# Patient Record
Sex: Female | Born: 1985 | Hispanic: No | Marital: Married | Smoking: Never smoker
Health system: Southern US, Community
[De-identification: ages and names within clinical notes are randomized; demographics above are authoritative.]

## PROBLEM LIST (undated history)

## (undated) ENCOUNTER — Inpatient Hospital Stay (HOSPITAL_COMMUNITY): Payer: Self-pay

---

## 2020-07-06 ENCOUNTER — Encounter (HOSPITAL_BASED_OUTPATIENT_CLINIC_OR_DEPARTMENT_OTHER): Payer: Self-pay | Admitting: Emergency Medicine

## 2020-07-06 ENCOUNTER — Emergency Department (HOSPITAL_BASED_OUTPATIENT_CLINIC_OR_DEPARTMENT_OTHER)
Admission: EM | Admit: 2020-07-06 | Discharge: 2020-07-06 | Disposition: A | Discharge status: Home / Self Care | Payer: Self-pay | Attending: Emergency Medicine | Admitting: Emergency Medicine

## 2020-07-06 ENCOUNTER — Other Ambulatory Visit: Payer: Self-pay

## 2020-07-06 DIAGNOSIS — N390 Urinary tract infection, site not specified: Secondary | ICD-10-CM | POA: Insufficient documentation

## 2020-07-06 LAB — URINALYSIS, ROUTINE W REFLEX MICROSCOPIC
Bilirubin Urine: NEGATIVE
Glucose, UA: NEGATIVE mg/dL
Hgb urine dipstick: NEGATIVE
Ketones, ur: NEGATIVE mg/dL
Nitrite: NEGATIVE
Protein, ur: NEGATIVE mg/dL
Specific Gravity, Urine: 1.025 (ref 1.005–1.030)
pH: 5.5 (ref 5.0–8.0)

## 2020-07-06 LAB — URINALYSIS, MICROSCOPIC (REFLEX): RBC / HPF: NONE SEEN RBC/hpf (ref 0–5)

## 2020-07-06 LAB — PREGNANCY, URINE: Preg Test, Ur: NEGATIVE

## 2020-07-06 MED ORDER — PHENAZOPYRIDINE HCL 200 MG PO TABS
200.0000 mg | ORAL_TABLET | Freq: Three times a day (TID) | ORAL | 0 refills | Status: DC
Start: 2020-07-06 — End: 2020-08-15

## 2020-07-06 MED ORDER — NITROFURANTOIN MONOHYD MACRO 100 MG PO CAPS
100.0000 mg | ORAL_CAPSULE | Freq: Two times a day (BID) | ORAL | 0 refills | Status: AC
Start: 2020-07-06 — End: 2020-07-11

## 2020-07-06 NOTE — ED Triage Notes (Signed)
Pt reports vaginal irritation, dysuria and discharge after sexual intercourse x4 days. Pt denies bleeding, denies fever.  Language barrier, patient speaks [place]

## 2020-07-06 NOTE — ED Provider Notes (Signed)
[facility] EMERGENCY DEPARTMENT Provider Note   CSN: [id number] Arrival date & time: 07/06/20  1212     History Chief Complaint  Patient presents with   Vaginal Itching    Miranda Caldwell is a 34 y.o. female.  34 year old female presents with complaint of urinary frequency with dysuria.  Patient states that she got married 2 weeks ago, symptoms started about 5 days ago.  Denies discharge, hematuria, back pain, fevers, vomiting.  A language interpreter was used [place]).       History reviewed. No pertinent past medical history.  There are no problems to display for this patient.   History reviewed. No pertinent surgical history.   OB History   No obstetric history on file.     History reviewed. No pertinent family history.  Social History   Tobacco Use   Smoking status: Never Smoker  Substance Use Topics   Alcohol use: Never   Drug use: Never    Home Medications Prior to Admission medications   Medication Sig Start Date End Date Taking? Authorizing Provider  nitrofurantoin, macrocrystal-monohydrate, (MACROBID) 100 MG capsule Take 1 capsule (100 mg total) by mouth 2 (two) times daily for 5 days. 07/06/20 07/11/20  Jeannie Fend, PA-C  phenazopyridine (PYRIDIUM) 200 MG tablet Take 1 tablet (200 mg total) by mouth 3 (three) times daily. 07/06/20   Jeannie Fend, PA-C    Allergies    Patient has no allergy information on record.  Review of Systems   Review of Systems  Constitutional: Negative for fever.  Respiratory: Negative for shortness of breath.   Cardiovascular: Negative for chest pain.  Gastrointestinal: Negative for abdominal pain, nausea and vomiting.  Genitourinary: Positive for dysuria and frequency. Negative for hematuria, vaginal bleeding and vaginal discharge.  Musculoskeletal: Negative for back pain.  Skin: Negative for rash and wound.  Allergic/Immunologic: Negative for immunocompromised state.  All other systems reviewed and  are negative.   Physical Exam Updated Vital Signs BP 112/70 (BP Location: Right Arm)    Pulse 92    Temp 98.4 F (36.9 C) (Oral)    Resp 18    Ht 5\' 4"  (1.626 m)    Wt 71.7 kg    LMP 06/17/2020 (Approximate)    SpO2 96%    BMI 27.12 kg/m   Physical Exam Vitals and nursing note reviewed.  Constitutional:      General: She is not in acute distress.    Appearance: She is well-developed. She is not diaphoretic.  HENT:     Head: Normocephalic and atraumatic.  Cardiovascular:     Rate and Rhythm: Normal rate and regular rhythm.     Pulses: Normal pulses.     Heart sounds: Normal heart sounds.  Pulmonary:     Effort: Pulmonary effort is normal.     Breath sounds: Normal breath sounds.  Abdominal:     Palpations: Abdomen is soft.     Tenderness: There is abdominal tenderness in the suprapubic area. There is no right CVA tenderness or left CVA tenderness.  Skin:    General: Skin is warm and dry.     Findings: No erythema or rash.  Neurological:     Mental Status: She is alert and oriented to person, place, and time.  Psychiatric:        Behavior: Behavior normal.     ED Results / Procedures / Treatments   Labs (all labs ordered are listed, but only abnormal results are displayed) Labs Reviewed  URINALYSIS,  ROUTINE W REFLEX MICROSCOPIC - Abnormal; Notable for the following components:      Result Value   Leukocytes,Ua SMALL (*)    All other components within normal limits  URINALYSIS, MICROSCOPIC (REFLEX) - Abnormal; Notable for the following components:   Bacteria, UA MANY (*)    All other components within normal limits  PREGNANCY, URINE    EKG None  Radiology No results found.  Procedures Procedures (including critical care time)  Medications Ordered in ED Medications - No data to display  ED Course  I have reviewed the triage vital signs and the nursing notes.  Pertinent labs & imaging results that were available during my care of the patient were reviewed  by me and considered in my medical decision making (see chart for details).  Clinical Course as of Jul 06 1413  Wynelle Link Jul 06, 2020  [id number] 34 year old female presents with complaint of frequency and dysuria for the past few days.  Patient is found to have mild tenderness in suprapubic area, no CVA tenderness.  Vitals reviewed, patient is afebrile, well-appearing, no history of fever or vomiting. Denies vaginal discharge, states she got married 2 weeks ago and wonders if sitting on a dirty toilet may have caused her symptoms. Discussed importance of voiding after intercourse. Urinalysis shows possible small UTI with leukocytes and many bacteria.  Recommend Macrobid, will give Pyridium for her discomfort, recheck if symptoms persist return to ER for worsening or concerning symptoms.   [LM]    Clinical Course User Index [LM] Alden Hipp   MDM Rules/Calculators/A&P                          Final Clinical Impression(s) / ED Diagnoses Final diagnoses:  Urinary tract infection in female    Rx / DC Orders ED Discharge Orders         Ordered    nitrofurantoin, macrocrystal-monohydrate, (MACROBID) 100 MG capsule  2 times daily     Discontinue  Reprint     07/06/20 1411    phenazopyridine (PYRIDIUM) 200 MG tablet  3 times daily     Discontinue  Reprint     07/06/20 1411           Jeannie Fend, PA-C 07/06/20 1414    Terrilee Files, MD 07/06/20 [phone]

## 2020-07-06 NOTE — Discharge Instructions (Addendum)
Prenez l'antibiotique (Macrobid) deux fois par jour tel que prescrit. Prenez Pyridium au besoin comme prescrit pour [employer] miction.  Retournez aux urgences pour [employer], des vomissements, des maux de [place], [place] aggravation ou des symptmes inquitants.   Take the antibiotic (Macrobid) twice daily as prescribed. Take Pyridium as needed as prescribed for pain with urinating.  Return to the ER for fever, vomiting, back pain, worsening or concerning symptoms.

## 2020-08-15 ENCOUNTER — Inpatient Hospital Stay (HOSPITAL_COMMUNITY)
Admission: AD | Admit: 2020-08-15 | Discharge: 2020-08-15 | Disposition: A | Discharge status: Home / Self Care | Payer: Medicaid Other | Attending: Obstetrics & Gynecology | Admitting: Obstetrics & Gynecology

## 2020-08-15 ENCOUNTER — Other Ambulatory Visit: Payer: Self-pay

## 2020-08-15 DIAGNOSIS — N912 Amenorrhea, unspecified: Secondary | ICD-10-CM | POA: Insufficient documentation

## 2020-08-15 NOTE — MAU Provider Note (Signed)
S Ms. Miranda Caldwell is a 34 y.o. No obstetric history on file. female who presents to MAU today for pregnancy confirmation. She denies pain or VB.  O BP 120/78 (BP Location: Right Arm)   Pulse 75   Temp 97.9 F (36.6 C) (Oral)   Resp 20   Wt 70.4 kg   LMP 07/19/2020   SpO2 100%   BMI 26.62 kg/m  Physical Exam Vitals and nursing note reviewed. Exam conducted with a chaperone present.  Constitutional:      Appearance: Normal appearance.  HENT:     Head: Normocephalic.  Cardiovascular:     Rate and Rhythm: Normal rate.  Pulmonary:     Effort: Pulmonary effort is normal. No respiratory distress.  Neurological:     General: No focal deficit present.     Mental Status: She is alert and oriented to person, place, and time.  Psychiatric:        Mood and Affect: Mood normal.    MDM: Pt informed that without concerning sx a pregnancy test will not be performed in MAU. List provided for area OBGYN providers.  A Amenorrhea Medical screening exam complete  P Discharge from MAU in stable condition List of options for follow-up given  Patient may return to MAU as needed for pregnancy related complaints  Donette Larry, CNM 08/15/2020 3:45 PM

## 2020-08-15 NOTE — MAU Note (Signed)
Presents for pregnancy comfirmation after having + HPT.  Denies any pain.

## 2020-08-15 NOTE — Discharge Instructions (Signed)
[place] [facility] Providers    [facility]       Phone: [phone]  [facility]   Phone: [phone]  [facility]  Phone: [phone]  [facility]  Phone: [phone]  [facility]  Phone: [phone]  [facility]   Phone: [phone]  [facility]       Phone: [phone]  [facility]    Phone: [phone]   [facility]    Phone: [phone]  [facility]    Phone: [phone]  [facility]    Phone: [phone]  [facility]       Phone: [phone]   [facility]  Phone: [phone]  [facility]    Phone: [phone]  [facility]   Phone: [phone]  [facility]      Phone: [phone]  [facility]    Phone: [phone]   

## 2020-09-02 ENCOUNTER — Ambulatory Visit (INDEPENDENT_AMBULATORY_CARE_PROVIDER_SITE_OTHER): Payer: Self-pay | Admitting: Advanced Practice Midwife

## 2020-09-02 ENCOUNTER — Other Ambulatory Visit: Payer: Self-pay

## 2020-09-02 ENCOUNTER — Other Ambulatory Visit (HOSPITAL_COMMUNITY)
Admission: RE | Admit: 2020-09-02 | Discharge: 2020-09-02 | Disposition: A | Discharge status: Home / Self Care | Payer: Self-pay | Source: Ambulatory Visit | Attending: Advanced Practice Midwife | Admitting: Advanced Practice Midwife

## 2020-09-02 ENCOUNTER — Encounter: Payer: Self-pay | Admitting: Advanced Practice Midwife

## 2020-09-02 VITALS — BP 105/73 | HR 85 | Wt 147.1 lb

## 2020-09-02 DIAGNOSIS — Z789 Other specified health status: Secondary | ICD-10-CM

## 2020-09-02 DIAGNOSIS — Z3A01 Less than 8 weeks gestation of pregnancy: Secondary | ICD-10-CM

## 2020-09-02 DIAGNOSIS — Z3401 Encounter for supervision of normal first pregnancy, first trimester: Secondary | ICD-10-CM

## 2020-09-02 DIAGNOSIS — Z3400 Encounter for supervision of normal first pregnancy, unspecified trimester: Secondary | ICD-10-CM | POA: Insufficient documentation

## 2020-09-02 LAB — POCT URINALYSIS DIPSTICK OB
Glucose, UA: NEGATIVE
Nitrite, UA: NEGATIVE
Spec Grav, UA: 1.025 (ref 1.010–1.025)
pH, UA: 6.5 (ref 5.0–8.0)

## 2020-09-02 NOTE — Progress Notes (Signed)
  Subjective:    Miranda Caldwell is a G1P0 [redacted]w[redacted]d being seen today for her first obstetrical visit.  Her obstetrical history is significant for Primigravida, [employer] [place]). Patient does intend to breast feed. Pregnancy history fully reviewed.  Husband speaks English but Ipad interpretor used  Patient reports no complaints.  Vitals:   09/02/20 0808  BP: 105/73  Pulse: 85  Weight: 147 lb 1.3 oz (66.7 kg)    HISTORY: OB History  Gravida Para Term Preterm AB Living  1            SAB TAB Ectopic Multiple Live Births               # Outcome Date GA Lbr Len/2nd Weight Sex Delivery Anes PTL Lv  1 Current            History reviewed. No pertinent past medical history. History reviewed. No pertinent surgical history. History reviewed. No pertinent family history.   Exam    Uterus:  Fundal Height: 8 cm  Pelvic Exam:    Perineum: Normal Perineum   Vulva: Bartholin's, Urethra, Skene's normal   Vagina:  normal mucosa, normal discharge   pH:    Cervix: nulliparous appearance and small bleeding after Pap   Adnexa: no mass, fullness, tenderness   Bony Pelvis: gynecoid  System: Breast:  normal appearance, no masses or tenderness   Skin: normal coloration and turgor, no rashes    Neurologic: oriented, grossly non-focal   Extremities: normal strength, tone, and muscle mass   HEENT neck supple with midline trachea   Mouth/Teeth mucous membranes moist, pharynx normal without lesions   Neck no masses   Cardiovascular: regular rate and rhythm   Respiratory:  appears well, vitals normal, no respiratory distress, acyanotic, normal RR, ear and throat exam is normal, neck free of mass or lymphadenopathy, chest clear, no wheezing, crepitations, rhonchi, normal symmetric air entry   Abdomen: soft, non-tender; bowel sounds normal; no masses,  no organomegaly   Urinary: urethral meatus normal      Assessment:    Pregnancy: G1P0 Patient Active Problem List   Diagnosis Date  Noted  . Supervision of normal first pregnancy, antepartum 09/02/2020  . Language barrier 09/02/2020        Plan:     Initial labs drawn. Continue prenatal vitamins. Discussed and offered genetic screening options, including Quad screen/AFP, NIPS testing, and option to decline testing. Benefits/risks/alternatives reviewed. Pt aware that anatomy US is form of genetic screening with lower accuracy in detecting trisomies than blood work.  Pt chooses genetic screening today. NIPS: planned after 11 weeks. Ultrasound discussed; fetal anatomic survey: ordered. Problem list reviewed and updated. The nature of [facility] with multiple MDs and other Advanced Practice Providers was explained to patient; also emphasized that residents, students are part of our team. Routine obstetric precautions reviewed.    Follow up in 4 weeks. 50% of 30 min visit spent on counseling and coordination of care.      Wynelle Bourgeois 09/02/2020

## 2020-09-02 NOTE — Patient Instructions (Signed)
Genetic Testing During Pregnancy Genetic testing during pregnancy is also called prenatal genetic testing. This type of testing can determine if your baby is at risk of being born with a disorder caused by abnormal genes or chromosomes (genetic disorder). Chromosomes contain genes that control how your baby will develop in your womb. There are many different genetic disorders. Examples of genetic disorders that may be found through genetic testing include Down syndrome and cystic fibrosis. Gene changes (mutations) can be passed down through families. Genetic testing is offered to all women before or during pregnancy. You can choose whether to have genetic testing. Why is genetic testing done? Genetic testing is done during pregnancy to find out whether your child is at risk for a genetic disorder. Having genetic testing allows you to:  Discuss your test results and options with a genetic counselor.  Prepare for a baby that may be born with a genetic disorder. Learning about the disorder ahead of time helps you be better prepared to manage it. Your health care providers can also be prepared in case your baby requires special care before or after birth.  Consider whether you want to continue with the pregnancy. In some cases, genetic testing may be done to learn about the traits a child will inherit. Types of genetic tests There are two basic types of genetic testing. Screening tests indicate whether your developing baby (fetus) is at higher risk for a genetic disorder. Diagnostic tests check actual fetal cells to diagnose a genetic disorder. Screening tests     Screening tests will not harm your baby. They are recommended for all pregnant women. Types of screening tests include:  Carrier screening. This test involves checking genes from both parents by testing their blood or saliva. The test checks to find out if the parents carry a genetic mutation that may be passed to a baby. In most cases,  both parents must carry the mutation for a baby to be at risk.  First trimester screening. This test combines a blood test with sound wave imaging of your baby (fetal ultrasound). This screening test checks for a risk of Down syndrome or other defects caused by having extra chromosomes. It also checks for defects of the heart, abdomen, or skeleton.  Second trimester screening also combines a blood test with a fetal ultrasound exam. It checks for a risk of genetic defects of the face, brain, spine, heart, or limbs.  Combined or sequential screening. This type of testing combines the results of first and second trimester screening. This type of testing may be more accurate than first or second trimester screening alone.  Cell-free DNA testing. This is a blood test that detects cells released by the placenta that get into the mother's blood. It can be used to check for a risk of Down syndrome, other extra chromosome syndromes, and disorders caused by abnormal numbers of sex chromosomes. This test can be done any time after 10 weeks of pregnancy.  Diagnostic tests Diagnostic tests carry slight risks of problems, including bleeding, infection, and loss of the pregnancy. These tests are done only if your baby is at risk for a genetic disorder. You may meet with a genetic counselor to discuss the risks and benefits before having diagnostic tests. Examples of diagnostic tests include:  Chorionic villus sampling (CVS). This involves a procedure to remove and test a sample of cells taken from the placenta. The procedure may be done between 10 and 12 weeks of pregnancy.  Amniocentesis. This involves a   procedure to remove and test a sample of fluid (amniotic fluid) and cells from the sac that surrounds the developing baby. The procedure may be done between 15 and 20 weeks of pregnancy. What do the results mean? For a screening test:  If the results are negative, it often means that your child is not at higher  risk. There is still a slight chance your child could have a genetic disorder.  If the results are positive, it does not mean your child will have a genetic disorder. It may mean that your child has a higher-than-normal risk for a genetic disorder. In that case, you may want to talk with a genetic counselor about whether you should have diagnostic genetic tests. For a diagnostic test:  If the result is negative, it is unlikely that your child will have a genetic disorder.  If the test is positive for a genetic disorder, it is likely that your child will have the disorder. The test may not tell how severe the disorder will be. Talk with your health care provider about your options. Questions to ask your health care provider Before talking to your health care provider about genetic testing, find out if there is a history of genetic disorders in your family. It may also help to know your family's ethnic origins. Then ask your health care provider the following questions:  Is my baby at risk for a genetic disorder?  What are the benefits of having genetic screening?  What tests are best for me and my baby?  What are the risks of each test?  If I get a positive result on a screening test, what is the next step?  Should I meet with a genetic counselor before having a diagnostic test?  Should my partner or other members of my family be tested?  How much do the tests cost? Will my insurance cover the testing? Summary  Genetic testing is done during pregnancy to find out whether your child is at risk for a genetic disorder.  Genetic testing is offered to all women before or during pregnancy. You can choose whether to have genetic testing.  There are two basic types of genetic testing. Screening tests indicate whether your developing baby (fetus) is at higher risk for a genetic disorder. Diagnostic tests check actual fetal cells to diagnose a genetic disorder.  If a diagnostic genetic test is  positive, talk with your health care provider about your options. This information is not intended to replace advice given to you by your health care provider. Make sure you discuss any questions you have with your health care provider. Document Revised: 03/08/2019 Document Reviewed: 01/30/2018 Elsevier Patient Education  2020 Elsevier Inc. First Trimester of Pregnancy  The first trimester of pregnancy is from week 1 until the end of week 13 (months 1 through 3). During this time, your baby will begin to develop inside you. At 6-8 weeks, the eyes and face are formed, and the heartbeat can be seen on ultrasound. At the end of 12 weeks, all the baby's organs are formed. Prenatal care is all the medical care you receive before the birth of your baby. Make sure you get good prenatal care and follow all of your doctor's instructions. Follow these instructions at home: Medicines  Take over-the-counter and prescription medicines only as told by your doctor. Some medicines are safe and some medicines are not safe during pregnancy.  Take a prenatal vitamin that contains at least 600 micrograms (mcg) of folic acid.    If you have trouble pooping (constipation), take medicine that will make your stool soft (stool softener) if your doctor approves. Eating and drinking   Eat regular, healthy meals.  Your doctor will tell you the amount of weight gain that is right for you.  Avoid raw meat and uncooked cheese.  If you feel sick to your stomach (nauseous) or throw up (vomit): ? Eat 4 or 5 small meals a day instead of 3 large meals. ? Try eating a few soda crackers. ? Drink liquids between meals instead of during meals.  To prevent constipation: ? Eat foods that are high in fiber, like fresh fruits and vegetables, whole grains, and beans. ? Drink enough fluids to keep your pee (urine) clear or pale yellow. Activity  Exercise only as told by your doctor. Stop exercising if you have cramps or pain in  your lower belly (abdomen) or low back.  Do not exercise if it is too hot, too humid, or if you are in a place of great height (high altitude).  Try to avoid standing for long periods of time. Move your legs often if you must stand in one place for a long time.  Avoid heavy lifting.  Wear low-heeled shoes. Sit and stand up straight.  You can have sex unless your doctor tells you not to. Relieving pain and discomfort  Wear a good support bra if your breasts are sore.  Take warm water baths (sitz baths) to soothe pain or discomfort caused by hemorrhoids. Use hemorrhoid cream if your doctor says it is okay.  Rest with your legs raised if you have leg cramps or low back pain.  If you have puffy, bulging veins (varicose veins) in your legs: ? Wear support hose or compression stockings as told by your doctor. ? Raise (elevate) your feet for 15 minutes, 3-4 times a day. ? Limit salt in your food. Prenatal care  Schedule your prenatal visits by the twelfth week of pregnancy.  Write down your questions. Take them to your prenatal visits.  Keep all your prenatal visits as told by your doctor. This is important. Safety  Wear your seat belt at all times when driving.  Make a list of emergency phone numbers. The list should include numbers for family, friends, the hospital, and police and fire departments. General instructions  Ask your doctor for a referral to a local prenatal class. Begin classes no later than at the start of month 6 of your pregnancy.  Ask for help if you need counseling or if you need help with nutrition. Your doctor can give you advice or tell you where to go for help.  Do not use hot tubs, steam rooms, or saunas.  Do not douche or use tampons or scented sanitary pads.  Do not cross your legs for long periods of time.  Avoid all herbs and alcohol. Avoid drugs that are not approved by your doctor.  Do not use any tobacco products, including cigarettes, chewing  tobacco, and electronic cigarettes. If you need help quitting, ask your doctor. You may get counseling or other support to help you quit.  Avoid cat litter boxes and soil used by cats. These carry germs that can cause birth defects in the baby and can cause a loss of your baby (miscarriage) or stillbirth.  Visit your dentist. At home, brush your teeth with a soft toothbrush. Be gentle when you floss. Contact a doctor if:  You are dizzy.  You have mild cramps or pressure   in your lower belly.  You have a nagging pain in your belly area.  You continue to feel sick to your stomach, you throw up, or you have watery poop (diarrhea).  You have a bad smelling fluid coming from your vagina.  You have pain when you pee (urinate).  You have increased puffiness (swelling) in your face, hands, legs, or ankles. Get help right away if:  You have a fever.  You are leaking fluid from your vagina.  You have spotting or bleeding from your vagina.  You have very bad belly cramping or pain.  You gain or lose weight rapidly.  You throw up blood. It may look like coffee grounds.  You are around people who have German measles, fifth disease, or chickenpox.  You have a very bad headache.  You have shortness of breath.  You have any kind of trauma, such as from a fall or a car accident. Summary  The first trimester of pregnancy is from week 1 until the end of week 13 (months 1 through 3).  To take care of yourself and your unborn baby, you will need to eat healthy meals, take medicines only if your doctor tells you to do so, and do activities that are safe for you and your baby.  Keep all follow-up visits as told by your doctor. This is important as your doctor will have to ensure that your baby is healthy and growing well. This information is not intended to replace advice given to you by your health care provider. Make sure you discuss any questions you have with your health care  provider. Document Revised: 03/08/2019 Document Reviewed: 11/23/2016 Elsevier Patient Education  2020 Elsevier Inc.  

## 2020-09-02 NOTE — Progress Notes (Signed)
[employer] language services [place] [phone].  2nd interpreter used [place].

## 2020-09-03 LAB — CYTOLOGY - PAP
Chlamydia: NEGATIVE
Comment: NEGATIVE
Comment: NEGATIVE
Comment: NORMAL
Diagnosis: NEGATIVE
High risk HPV: NEGATIVE
Neisseria Gonorrhea: NEGATIVE

## 2020-09-04 LAB — URINE CULTURE, OB REFLEX

## 2020-09-04 LAB — CULTURE, OB URINE

## 2020-09-12 LAB — CBC/D/PLT+RPR+RH+ABO+RUB AB...
Antibody Screen: NEGATIVE
Basophils Absolute: 0 10*3/uL (ref 0.0–0.2)
Basos: 0 %
EOS (ABSOLUTE): 0 10*3/uL (ref 0.0–0.4)
Eos: 0 %
HCV Ab: <0.1 s/co ratio (ref 0.0–0.9)
HIV Screen 4th Generation wRfx: NONREACTIVE
Hematocrit: 40.7 % (ref 34.0–46.6)
Hemoglobin: 13.5 g/dL (ref 11.1–15.9)
Hepatitis B Surface Ag: NEGATIVE
Immature Grans (Abs): 0 10*3/uL (ref 0.0–0.1)
Immature Granulocytes: 0 %
Lymphocytes Absolute: 1.2 10*3/uL (ref 0.7–3.1)
Lymphs: 31 %
MCH: 28.2 pg (ref 26.6–33.0)
MCHC: 33.2 g/dL (ref 31.5–35.7)
MCV: 85 fL (ref 79–97)
Monocytes Absolute: 0.3 10*3/uL (ref 0.1–0.9)
Monocytes: 9 %
Neutrophils Absolute: 2.4 10*3/uL (ref 1.4–7.0)
Neutrophils: 60 %
Platelets: 198 10*3/uL (ref 150–450)
RBC: 4.79 x10E6/uL (ref 3.77–5.28)
RDW: 13.6 % (ref 11.7–15.4)
RPR Ser Ql: NONREACTIVE
Rh Factor: POSITIVE
Rubella Antibodies, IGG: 4.34 index (ref >0.99)
WBC: 4 10*3/uL (ref 3.4–10.8)

## 2020-09-12 LAB — CYSTIC FIBROSIS GENE TEST

## 2020-09-12 LAB — SMN1 COPY NUMBER ANALYSIS (SMA CARRIER SCREENING)

## 2020-09-12 LAB — HCV INTERPRETATION

## 2020-09-30 ENCOUNTER — Encounter: Payer: Self-pay | Admitting: Advanced Practice Midwife

## 2020-10-17 ENCOUNTER — Encounter: Payer: Self-pay | Admitting: Obstetrics & Gynecology

## 2020-10-17 ENCOUNTER — Other Ambulatory Visit: Payer: Self-pay

## 2020-10-17 ENCOUNTER — Ambulatory Visit (INDEPENDENT_AMBULATORY_CARE_PROVIDER_SITE_OTHER): Payer: Medicaid Other | Admitting: Obstetrics & Gynecology

## 2020-10-17 VITALS — BP 99/60 | HR 72 | Wt 140.0 lb

## 2020-10-17 DIAGNOSIS — Z3400 Encounter for supervision of normal first pregnancy, unspecified trimester: Secondary | ICD-10-CM

## 2020-10-17 DIAGNOSIS — Z23 Encounter for immunization: Secondary | ICD-10-CM

## 2020-10-17 DIAGNOSIS — Z3A12 12 weeks gestation of pregnancy: Secondary | ICD-10-CM

## 2020-10-17 NOTE — Progress Notes (Signed)
   PRENATAL VISIT NOTE  Subjective:  Miranda Caldwell is a 34 y.o. G1P0 at [redacted]w[redacted]d being seen today for ongoing prenatal care. Patient is French-speaking only, interpreter present for this encounter ([employer] language interpreter [place] [phone]).  Accompanied by her husband.  She is currently monitored for the following issues for this low-risk pregnancy and has Supervision of normal first pregnancy, antepartum and Language barrier on their problem list.  Patient reports no complaints.  Contractions: Not present. Vag. Bleeding: None.  Movement: Absent. Denies leaking of fluid.   The following portions of the patient's history were reviewed and updated as appropriate: allergies, current medications, past family history, past medical history, past social history, past surgical history and problem list.   Objective:   Vitals:   10/17/20 1106  BP: 99/60  Pulse: 72  Weight: 140 lb (63.5 kg)    Fetal Status: Fetal Heart Rate (bpm): 155   Movement: Absent     General:  Alert, oriented and cooperative. Patient is in no acute distress.  Skin: Skin is warm and dry. No rash noted.   Cardiovascular: Normal heart rate noted  Respiratory: Normal respiratory effort, no problems with respiration noted  Abdomen: Soft, gravid, appropriate for gestational age.  Pain/Pressure: Absent     Pelvic: Cervical exam deferred        Extremities: Normal range of motion.  Edema: None  Mental Status: Normal mood and affect. Normal behavior. Normal judgment and thought content.   Assessment and Plan:  Pregnancy: G1P0 at [redacted]w[redacted]d 1. Needs flu shot Flu shot given. - Flu Vaccine QUAD 36+ mos IM (Fluarix, Fluzone & Afluria Quad PF  2. [redacted] weeks gestation of pregnancy 3. Supervision of normal first pregnancy, antepartum Declines NIPS for now. Anatomy scan already scheduled. No other complaints or concerns.  Routine obstetric precautions reviewed.  Please refer to After Visit Summary for other counseling recommendations.    Return in about 4 weeks (around 11/14/2020) for OFFICE OB VISIT (MD or APP).  Future Appointments  Date Time Provider Department Center  12/12/2020  2:45 PM [facility] US4 [facility]    Jaynie Collins, MD

## 2020-10-17 NOTE — Patient Instructions (Signed)
First Trimester of Pregnancy The first trimester of pregnancy is from week 1 until the end of week 13 (months 1 through 3). A week after a sperm fertilizes an egg, the egg will implant on the wall of the uterus. This embryo will begin to develop into a baby. Genes from you and your partner will form the baby. The female genes will determine whether the baby will be a boy or a girl. At 6-8 weeks, the eyes and face will be formed, and the heartbeat can be seen on ultrasound. At the end of 12 weeks, all the baby's organs will be formed. Now that you are pregnant, you will want to do everything you can to have a healthy baby. Two of the most important things are to get good prenatal care and to follow your health care provider's instructions. Prenatal care is all the medical care you receive before the baby's birth. This care will help prevent, find, and treat any problems during the pregnancy and childbirth. Body changes during your first trimester Your body goes through many changes during pregnancy. The changes vary from woman to woman.  You may gain or lose a couple of pounds at first.  You may feel sick to your stomach (nauseous) and you may throw up (vomit). If the vomiting is uncontrollable, call your health care provider.  You may tire easily.  You may develop headaches that can be relieved by medicines. All medicines should be approved by your health care provider.  You may urinate more often. Painful urination may mean you have a bladder infection.  You may develop heartburn as a result of your pregnancy.  You may develop constipation because certain hormones are causing the muscles that push stool through your intestines to slow down.  You may develop hemorrhoids or swollen veins (varicose veins).  Your breasts may begin to grow larger and become tender. Your nipples may stick out more, and the tissue that surrounds them (areola) may become darker.  Your gums may bleed and may be  sensitive to brushing and flossing.  Dark spots or blotches (chloasma, mask of pregnancy) may develop on your face. This will likely fade after the baby is born.  Your menstrual periods will stop.  You may have a loss of appetite.  You may develop cravings for certain kinds of food.  You may have changes in your emotions from day to day, such as being excited to be pregnant or being concerned that something may go wrong with the pregnancy and baby.  You may have more vivid and strange dreams.  You may have changes in your hair. These can include thickening of your hair, rapid growth, and changes in texture. Some women also have hair loss during or after pregnancy, or hair that feels dry or thin. Your hair will most likely return to normal after your baby is born. What to expect at prenatal visits During a routine prenatal visit:  You will be weighed to make sure you and the baby are growing normally.  Your blood pressure will be taken.  Your abdomen will be measured to track your baby's growth.  The fetal heartbeat will be listened to between weeks 10 and 14 of your pregnancy.  Test results from any previous visits will be discussed. Your health care provider may ask you:  How you are feeling.  If you are feeling the baby move.  If you have had any abnormal symptoms, such as leaking fluid, bleeding, severe headaches, or abdominal   cramping.  If you are using any tobacco products, including cigarettes, chewing tobacco, and electronic cigarettes.  If you have any questions. Other tests that may be performed during your first trimester include:  Blood tests to find your blood type and to check for the presence of any previous infections. The tests will also be used to check for low iron levels (anemia) and protein on red blood cells (Rh antibodies). Depending on your risk factors, or if you previously had diabetes during pregnancy, you may have tests to check for high blood sugar  that affects pregnant women (gestational diabetes).  Urine tests to check for infections, diabetes, or protein in the urine.  An ultrasound to confirm the proper growth and development of the baby.  Fetal screens for spinal cord problems (spina bifida) and Down syndrome.  HIV (human immunodeficiency virus) testing. Routine prenatal testing includes screening for HIV, unless you choose not to have this test.  You may need other tests to make sure you and the baby are doing well. Follow these instructions at home: Medicines  Follow your health care provider's instructions regarding medicine use. Specific medicines may be either safe or unsafe to take during pregnancy.  Take a prenatal vitamin that contains at least 600 micrograms (mcg) of folic acid.  If you develop constipation, try taking a stool softener if your health care provider approves. Eating and drinking   Eat a balanced diet that includes fresh fruits and vegetables, whole grains, good sources of protein such as meat, eggs, or tofu, and low-fat dairy. Your health care provider will help you determine the amount of weight gain that is right for you.  Avoid raw meat and uncooked cheese. These carry germs that can cause birth defects in the baby.  Eating four or five small meals rather than three large meals a day may help relieve nausea and vomiting. If you start to feel nauseous, eating a few soda crackers can be helpful. Drinking liquids between meals, instead of during meals, also seems to help ease nausea and vomiting.  Limit foods that are high in fat and processed sugars, such as fried and sweet foods.  To prevent constipation: ? Eat foods that are high in fiber, such as fresh fruits and vegetables, whole grains, and beans. ? Drink enough fluid to keep your urine clear or pale yellow. Activity  Exercise only as directed by your health care provider. Most women can continue their usual exercise routine during  pregnancy. Try to exercise for 30 minutes at least 5 days a week. Exercising will help you: ? Control your weight. ? Stay in shape. ? Be prepared for labor and delivery.  Experiencing pain or cramping in the lower abdomen or lower back is a good sign that you should stop exercising. Check with your health care provider before continuing with normal exercises.  Try to avoid standing for long periods of time. Move your legs often if you must stand in one place for a long time.  Avoid heavy lifting.  Wear low-heeled shoes and practice good posture.  You may continue to have sex unless your health care provider tells you not to. Relieving pain and discomfort  Wear a good support bra to relieve breast tenderness.  Take warm sitz baths to soothe any pain or discomfort caused by hemorrhoids. Use hemorrhoid cream if your health care provider approves.  Rest with your legs elevated if you have leg cramps or low back pain.  If you develop varicose veins in   your legs, wear support hose. Elevate your feet for 15 minutes, 3-4 times a day. Limit salt in your diet. Prenatal care  Schedule your prenatal visits by the twelfth week of pregnancy. They are usually scheduled monthly at first, then more often in the last 2 months before delivery.  Write down your questions. Take them to your prenatal visits.  Keep all your prenatal visits as told by your health care provider. This is important. Safety  Wear your seat belt at all times when driving.  Make a list of emergency phone numbers, including numbers for family, friends, the hospital, and police and fire departments. General instructions  Ask your health care provider for a referral to a local prenatal education class. Begin classes no later than the beginning of month 6 of your pregnancy.  Ask for help if you have counseling or nutritional needs during pregnancy. Your health care provider can offer advice or refer you to specialists for help  with various needs.  Do not use hot tubs, steam rooms, or saunas.  Do not douche or use tampons or scented sanitary pads.  Do not cross your legs for long periods of time.  Avoid cat litter boxes and soil used by cats. These carry germs that can cause birth defects in the baby and possibly loss of the fetus by miscarriage or stillbirth.  Avoid all smoking, herbs, alcohol, and medicines not prescribed by your health care provider. Chemicals in these products affect the formation and growth of the baby.  Do not use any products that contain nicotine or tobacco, such as cigarettes and e-cigarettes. If you need help quitting, ask your health care provider. You may receive counseling support and other resources to help you quit.  Schedule a dentist appointment. At home, brush your teeth with a soft toothbrush and be gentle when you floss. Contact a health care provider if:  You have dizziness.  You have mild pelvic cramps, pelvic pressure, or nagging pain in the abdominal area.  You have persistent nausea, vomiting, or diarrhea.  You have a bad smelling vaginal discharge.  You have pain when you urinate.  You notice increased swelling in your face, hands, legs, or ankles.  You are exposed to fifth disease or chickenpox.  You are exposed to German measles (rubella) and have never had it. Get help right away if:  You have a fever.  You are leaking fluid from your vagina.  You have spotting or bleeding from your vagina.  You have severe abdominal cramping or pain.  You have rapid weight gain or loss.  You vomit blood or material that looks like coffee grounds.  You develop a severe headache.  You have shortness of breath.  You have any kind of trauma, such as from a fall or a car accident. Summary  The first trimester of pregnancy is from week 1 until the end of week 13 (months 1 through 3).  Your body goes through many changes during pregnancy. The changes vary from  woman to woman.  You will have routine prenatal visits. During those visits, your health care provider will examine you, discuss any test results you may have, and talk with you about how you are feeling. This information is not intended to replace advice given to you by your health care provider. Make sure you discuss any questions you have with your health care provider. Document Revised: 10/28/2017 Document Reviewed: 10/27/2016 Elsevier Patient Education  2020 Elsevier Inc.  

## 2020-11-27 ENCOUNTER — Encounter: Payer: Self-pay | Admitting: Obstetrics & Gynecology

## 2020-11-27 ENCOUNTER — Telehealth: Payer: Self-pay | Admitting: Family Medicine

## 2020-11-27 NOTE — Telephone Encounter (Signed)
[facility] called and I spoke with [facility]. This pt is now getting her OB care at [facility] and no longer going to go to [facility] location. Please call [place] at [phone]

## 2020-11-29 NOTE — L&D Delivery Note (Signed)
OB/GYN Faculty Practice Delivery Note  Miranda Caldwell is a 35 y.o. G1P0 s/p vaginal delivery at [redacted]w[redacted]d. She was admitted for spontaneous onset of labor.   ROM: 4h 78m with clear fluid GBS Status: positive; adequate PCN prior to delivery Maximum Maternal Temperature: 98.17F  Labor Progress: Pt in latent labor on admission. She progressed to complete cervical dilation s/p AROM for clear fluid at 9cm. She then had an uncomplicated delivery s/p a short second stage as noted below.  Delivery Date/Time: 04/28/21 at 0809 Delivery: Called to room and patient was complete and pushing. Head delivered ROA. No nuchal cord present. Shoulder and body delivered in usual fashion. Infant with spontaneous cry, placed on mother's abdomen, dried and stimulated. Cord clamped x 2 after 1-minute delay, and cut by FOB under my direct supervision. Cord blood drawn. Placenta delivered spontaneously with gentle cord traction. Fundus firm with massage and Pitocin. Labia, perineum, vagina, and cervix were inspected, notable for second degree perineal laceration and bilateral labial lacerations s/p repair in standard fashion with use of 3-0 vicryl. Given ongoing bleeding s/p delivery, TXA was administered in addition to postpartum pitocin with good improvement s/p repair.  Placenta: 3-vessel cord, intact, sent to L&D Complications: none Lacerations: 2nd degree perineal laceration, bilateral labial lacerations EBL: 541 ml Analgesia: epidural  Infant: viable girl  APGARs 9 & 9  weight per medical record  Lynnda Shields, MD OB/GYN Fellow, Faculty Practice

## 2020-12-12 ENCOUNTER — Ambulatory Visit: Payer: Medicaid Other | Attending: Advanced Practice Midwife

## 2020-12-12 ENCOUNTER — Other Ambulatory Visit: Payer: Self-pay

## 2020-12-12 DIAGNOSIS — Z3A01 Less than 8 weeks gestation of pregnancy: Secondary | ICD-10-CM | POA: Diagnosis not present

## 2020-12-12 DIAGNOSIS — Z3400 Encounter for supervision of normal first pregnancy, unspecified trimester: Secondary | ICD-10-CM | POA: Diagnosis not present

## 2020-12-12 DIAGNOSIS — Z789 Other specified health status: Secondary | ICD-10-CM | POA: Insufficient documentation

## 2020-12-16 ENCOUNTER — Other Ambulatory Visit: Payer: Self-pay | Admitting: *Deleted

## 2020-12-16 DIAGNOSIS — Z362 Encounter for other antenatal screening follow-up: Secondary | ICD-10-CM

## 2021-01-09 ENCOUNTER — Other Ambulatory Visit: Payer: Self-pay

## 2021-01-09 ENCOUNTER — Ambulatory Visit: Payer: BC Managed Care – PPO | Attending: Obstetrics and Gynecology

## 2021-01-09 ENCOUNTER — Ambulatory Visit: Payer: BC Managed Care – PPO | Admitting: *Deleted

## 2021-01-09 ENCOUNTER — Encounter: Payer: Self-pay | Admitting: *Deleted

## 2021-01-09 DIAGNOSIS — Z3400 Encounter for supervision of normal first pregnancy, unspecified trimester: Secondary | ICD-10-CM

## 2021-01-09 DIAGNOSIS — Z362 Encounter for other antenatal screening follow-up: Secondary | ICD-10-CM | POA: Insufficient documentation

## 2021-01-09 DIAGNOSIS — Z3A24 24 weeks gestation of pregnancy: Secondary | ICD-10-CM

## 2021-01-14 ENCOUNTER — Encounter: Payer: Self-pay | Admitting: Obstetrics and Gynecology

## 2021-01-14 ENCOUNTER — Ambulatory Visit (INDEPENDENT_AMBULATORY_CARE_PROVIDER_SITE_OTHER): Payer: Medicaid Other | Admitting: Obstetrics and Gynecology

## 2021-01-14 ENCOUNTER — Other Ambulatory Visit: Payer: Self-pay

## 2021-01-14 VITALS — BP 111/60 | HR 74 | Wt 154.0 lb

## 2021-01-14 DIAGNOSIS — Z789 Other specified health status: Secondary | ICD-10-CM

## 2021-01-14 DIAGNOSIS — Z3400 Encounter for supervision of normal first pregnancy, unspecified trimester: Secondary | ICD-10-CM

## 2021-01-14 DIAGNOSIS — Z3A25 25 weeks gestation of pregnancy: Secondary | ICD-10-CM

## 2021-01-14 NOTE — Progress Notes (Signed)
   PRENATAL VISIT NOTE  Subjective:  Miranda Caldwell is a 35 y.o. G1P0 at [redacted]w[redacted]d being seen today for ongoing prenatal care.  She is currently monitored for the following issues for this low-risk pregnancy and has Supervision of normal first pregnancy, antepartum and Language barrier on their problem list.  Patient reports no complaints.  Contractions: Not present. Vag. Bleeding: None.  Movement: Present. Denies leaking of fluid.   The following portions of the patient's history were reviewed and updated as appropriate: allergies, current medications, past family history, past medical history, past social history, past surgical history and problem list.   Objective:   Vitals:   01/14/21 1605  BP: 111/60  Pulse: 74  Weight: 154 lb (69.9 kg)    Fetal Status: Fetal Heart Rate (bpm): 150 Fundal Height: 25 cm Movement: Present     General:  Alert, oriented and cooperative. Patient is in no acute distress.  Skin: Skin is warm and dry. No rash noted.   Cardiovascular: Normal heart rate noted  Respiratory: Normal respiratory effort, no problems with respiration noted  Abdomen: Soft, gravid, appropriate for gestational age.  Pain/Pressure: Absent     Pelvic: Cervical exam deferred        Extremities: Normal range of motion.  Edema: None  Mental Status: Normal mood and affect. Normal behavior. Normal judgment and thought content.   Assessment and Plan:  Pregnancy: G1P0 at [redacted]w[redacted]d  1. Supervision of normal first pregnancy, antepartum  2. [redacted] weeks gestation of pregnancy  3. Language barrier Husband interpreted [place] what patient didn't understand  Preterm labor symptoms and general obstetric precautions including but not limited to vaginal bleeding, contractions, leaking of fluid and fetal movement were reviewed in detail with the patient. Please refer to After Visit Summary for other counseling recommendations.   Return in about 2 weeks (around 01/28/2021) for low OB, in person, 3rd trim  labs, 2 hr GTT.  Future Appointments  Date Time Provider Department Center  02/05/2021  8:15 AM Levie Heritage, DO [facility] None    Conan Bowens, MD

## 2021-02-05 ENCOUNTER — Other Ambulatory Visit: Payer: Self-pay

## 2021-02-05 ENCOUNTER — Ambulatory Visit (INDEPENDENT_AMBULATORY_CARE_PROVIDER_SITE_OTHER): Payer: Medicaid Other | Admitting: Family Medicine

## 2021-02-05 VITALS — BP 124/61 | HR 77 | Wt 151.0 lb

## 2021-02-05 DIAGNOSIS — Z789 Other specified health status: Secondary | ICD-10-CM

## 2021-02-05 DIAGNOSIS — H547 Unspecified visual loss: Secondary | ICD-10-CM

## 2021-02-05 DIAGNOSIS — N644 Mastodynia: Secondary | ICD-10-CM

## 2021-02-05 DIAGNOSIS — Z3A28 28 weeks gestation of pregnancy: Secondary | ICD-10-CM

## 2021-02-05 DIAGNOSIS — Z3400 Encounter for supervision of normal first pregnancy, unspecified trimester: Secondary | ICD-10-CM

## 2021-02-05 NOTE — Progress Notes (Signed)
In person [place] interpreter used for entire visit. Armandina Stammer RN

## 2021-02-05 NOTE — Progress Notes (Signed)
   PRENATAL VISIT NOTE  Subjective:  Miranda Caldwell is a 35 y.o. G1P0 at [redacted]w[redacted]d being seen today for ongoing prenatal care.  She is currently monitored for the following issues for this low-risk pregnancy and has Supervision of normal first pregnancy, antepartum and Language barrier on their problem list.  Patient reports left lateral breast pain with movement for past 7 months. No skin changes or erythema. Also having difficulty with vision.  Contractions: Not present. Vag. Bleeding: None.  Movement: Present. Denies leaking of fluid.   The following portions of the patient's history were reviewed and updated as appropriate: allergies, current medications, past family history, past medical history, past social history, past surgical history and problem list.   Objective:   Vitals:   02/05/21 0831  BP: 124/61  Pulse: 77  Weight: 151 lb (68.5 kg)    Fetal Status: Fetal Heart Rate (bpm): 155 Fundal Height: 28 cm Movement: Present     General:  Alert, oriented and cooperative. Patient is in no acute distress.  Skin: Skin is warm and dry. No rash noted.   Breast: Left breast examined with chaperone -   Cardiovascular: Normal heart rate noted  Respiratory: Normal respiratory effort, no problems with respiration noted  Abdomen: Soft, gravid, appropriate for gestational age.  Pain/Pressure: Absent     Pelvic: Cervical exam deferred        Extremities: Normal range of motion.  Edema: None  Mental Status: Normal mood and affect. Normal behavior. Normal judgment and thought content.   Assessment and Plan:  Pregnancy: G1P0 at [redacted]w[redacted]d 1. [redacted] weeks gestation of pregnancy  2. Supervision of normal first pregnancy, antepartum FHT and FH normal - Glucose Tolerance, 2 Hours w/1 Hour - CBC - HIV antibody (with reflex) - RPR - US BREAST LTD UNI LEFT INC AXILLA; Future  3. Language barrier Interpreter used  4. Breast pain, left - US BREAST LTD UNI LEFT INC AXILLA; Future  5. Vision  problems Referral to Optometry.  Preterm labor symptoms and general obstetric precautions including but not limited to vaginal bleeding, contractions, leaking of fluid and fetal movement were reviewed in detail with the patient. Please refer to After Visit Summary for other counseling recommendations.   No follow-ups on file.  No future appointments.  Levie Heritage, DO

## 2021-02-06 LAB — CBC
Hematocrit: 32.2 % — ABNORMAL LOW (ref 34.0–46.6)
Hemoglobin: 10.8 g/dL — ABNORMAL LOW (ref 11.1–15.9)
MCH: 29.4 pg (ref 26.6–33.0)
MCHC: 33.5 g/dL (ref 31.5–35.7)
MCV: 88 fL (ref 79–97)
Platelets: 260 10*3/uL (ref 150–450)
RBC: 3.67 x10E6/uL — ABNORMAL LOW (ref 3.77–5.28)
RDW: 13 % (ref 11.7–15.4)
WBC: 7.1 10*3/uL (ref 3.4–10.8)

## 2021-02-06 LAB — GLUCOSE TOLERANCE, 2 HOURS W/ 1HR
Glucose, 1 hour: 120 mg/dL (ref 65–179)
Glucose, 2 hour: 87 mg/dL (ref 65–152)
Glucose, Fasting: 73 mg/dL (ref 65–91)

## 2021-02-06 LAB — RPR: RPR Ser Ql: NONREACTIVE

## 2021-02-06 LAB — HIV ANTIBODY (ROUTINE TESTING W REFLEX): HIV Screen 4th Generation wRfx: NONREACTIVE

## 2021-02-06 MED ORDER — FERROUS SULFATE 325 (65 FE) MG PO TABS: 325.0000 mg | ORAL_TABLET | ORAL | 1 refills | Status: DC

## 2021-02-06 NOTE — Addendum Note (Signed)
Addended by: Levie Heritage on: 02/06/2021 12:14 PM   Modules accepted: Orders

## 2021-02-09 ENCOUNTER — Telehealth: Payer: Self-pay

## 2021-02-09 NOTE — Telephone Encounter (Signed)
Called pt with [facility] interpreter [place] [phone]. Left message for pt to call the office to discuss results.  Brynli Ollis l [place], CMA

## 2021-02-09 NOTE — Telephone Encounter (Signed)
-----   Message from Levie Heritage, DO sent at 02/06/2021 12:14 PM EST ----- Patient anemic, needs oral iron to take every other day.  2hr GTT normal.

## 2021-02-17 ENCOUNTER — Encounter: Payer: Self-pay | Admitting: General Practice

## 2021-02-20 ENCOUNTER — Other Ambulatory Visit: Payer: Self-pay

## 2021-02-20 ENCOUNTER — Ambulatory Visit (INDEPENDENT_AMBULATORY_CARE_PROVIDER_SITE_OTHER): Payer: Medicaid Other | Admitting: Family Medicine

## 2021-02-20 VITALS — BP 121/66 | HR 101 | Wt 160.0 lb

## 2021-02-20 DIAGNOSIS — Z3400 Encounter for supervision of normal first pregnancy, unspecified trimester: Secondary | ICD-10-CM

## 2021-02-20 DIAGNOSIS — Z789 Other specified health status: Secondary | ICD-10-CM

## 2021-02-20 DIAGNOSIS — Z3A30 30 weeks gestation of pregnancy: Secondary | ICD-10-CM

## 2021-02-20 NOTE — Progress Notes (Signed)
   PRENATAL VISIT NOTE  Subjective:  Miranda Caldwell is a 35 y.o. G1P0 at [redacted]w[redacted]d being seen today for ongoing prenatal care.  She is currently monitored for the following issues for this low-risk pregnancy and has Supervision of normal first pregnancy, antepartum and Language barrier on their problem list.  Patient reports no complaints.  Contractions: Not present. Vag. Bleeding: None.  Movement: Present. Denies leaking of fluid.   The following portions of the patient's history were reviewed and updated as appropriate: allergies, current medications, past family history, past medical history, past social history, past surgical history and problem list.   Objective:   Vitals:   02/20/21 1101  BP: 121/66  Pulse: (!) 101  Weight: 160 lb (72.6 kg)    Fetal Status: Fetal Heart Rate (bpm): 150 Fundal Height: 30 cm Movement: Present     General:  Alert, oriented and cooperative. Patient is in no acute distress.  Skin: Skin is warm and dry. No rash noted.   Cardiovascular: Normal heart rate noted  Respiratory: Normal respiratory effort, no problems with respiration noted  Abdomen: Soft, gravid, appropriate for gestational age.  Pain/Pressure: Absent     Pelvic: Cervical exam deferred        Extremities: Normal range of motion.  Edema: None  Mental Status: Normal mood and affect. Normal behavior. Normal judgment and thought content.   Assessment and Plan:  Pregnancy: G1P0 at [redacted]w[redacted]d 1. [redacted] weeks gestation of pregnancy  2. Supervision of normal first pregnancy, antepartum FHT and FH normal  3. Language barrier Interpreter used  Preterm labor symptoms and general obstetric precautions including but not limited to vaginal bleeding, contractions, leaking of fluid and fetal movement were reviewed in detail with the patient. Please refer to After Visit Summary for other counseling recommendations.   No follow-ups on file.  Future Appointments  Date Time [name] Department Center   03/04/2021  7:30 AM [facility] [place] 1 [facility]  03/05/2021 11:15 AM Levie Heritage, DO [facility] None  03/19/2021 11:15 AM Levie Heritage, DO [facility] None  04/02/2021 11:15 AM Levie Heritage, DO [facility] None  04/09/2021 11:15 AM Levie Heritage, DO [facility] None  04/15/2021 11:15 AM Levie Heritage, DO [facility] None  04/23/2021 11:15 AM Willodean Rosenthal, MD [facility] None    Levie Heritage, DO

## 2021-02-20 NOTE — Progress Notes (Signed)
Video interpreter used for entire visit- [phone]

## 2021-03-01 ENCOUNTER — Emergency Department (HOSPITAL_BASED_OUTPATIENT_CLINIC_OR_DEPARTMENT_OTHER)
Admission: EM | Admit: 2021-03-01 | Discharge: 2021-03-01 | Disposition: A | Discharge status: Home / Self Care | Payer: Medicaid Other | Attending: Emergency Medicine | Admitting: Emergency Medicine

## 2021-03-01 ENCOUNTER — Other Ambulatory Visit: Payer: Self-pay

## 2021-03-01 DIAGNOSIS — O99513 Diseases of the respiratory system complicating pregnancy, third trimester: Secondary | ICD-10-CM | POA: Insufficient documentation

## 2021-03-01 DIAGNOSIS — Z20822 Contact with and (suspected) exposure to covid-19: Secondary | ICD-10-CM | POA: Diagnosis not present

## 2021-03-01 DIAGNOSIS — Z3A32 32 weeks gestation of pregnancy: Secondary | ICD-10-CM | POA: Insufficient documentation

## 2021-03-01 DIAGNOSIS — O2313 Infections of bladder in pregnancy, third trimester: Secondary | ICD-10-CM | POA: Diagnosis not present

## 2021-03-01 DIAGNOSIS — J069 Acute upper respiratory infection, unspecified: Secondary | ICD-10-CM | POA: Diagnosis not present

## 2021-03-01 DIAGNOSIS — N3000 Acute cystitis without hematuria: Secondary | ICD-10-CM | POA: Diagnosis not present

## 2021-03-01 LAB — COMPREHENSIVE METABOLIC PANEL
ALT: 15 U/L (ref 0–44)
AST: 19 U/L (ref 15–41)
Albumin: 3.1 g/dL — ABNORMAL LOW (ref 3.5–5.0)
Alkaline Phosphatase: 58 U/L (ref 38–126)
Anion gap: 9 (ref 5–15)
BUN: 5 mg/dL — ABNORMAL LOW (ref 6–20)
CO2: 20 mmol/L — ABNORMAL LOW (ref 22–32)
Calcium: 8.8 mg/dL — ABNORMAL LOW (ref 8.9–10.3)
Chloride: 102 mmol/L (ref 98–111)
Creatinine, Ser: 0.46 mg/dL (ref 0.44–1.00)
GFR, Estimated: >60 mL/min (ref >60)
Glucose, Bld: 85 mg/dL (ref 70–99)
Potassium: 3.6 mmol/L (ref 3.5–5.1)
Sodium: 131 mmol/L — ABNORMAL LOW (ref 135–145)
Total Bilirubin: 0.1 mg/dL — ABNORMAL LOW (ref 0.3–1.2)
Total Protein: 6.6 g/dL (ref 6.5–8.1)

## 2021-03-01 LAB — CBC WITH DIFFERENTIAL/PLATELET
Abs Immature Granulocytes: 0.05 10*3/uL (ref 0.00–0.07)
Basophils Absolute: 0 10*3/uL (ref 0.0–0.1)
Basophils Relative: 0 %
Eosinophils Absolute: 0 10*3/uL (ref 0.0–0.5)
Eosinophils Relative: 0 %
HCT: 31.3 % — ABNORMAL LOW (ref 36.0–46.0)
Hemoglobin: 10.9 g/dL — ABNORMAL LOW (ref 12.0–15.0)
Immature Granulocytes: 1 %
Lymphocytes Relative: 13 %
Lymphs Abs: 1.4 10*3/uL (ref 0.7–4.0)
MCH: 29.8 pg (ref 26.0–34.0)
MCHC: 34.8 g/dL (ref 30.0–36.0)
MCV: 85.5 fL (ref 80.0–100.0)
Monocytes Absolute: 0.6 10*3/uL (ref 0.1–1.0)
Monocytes Relative: 6 %
Neutro Abs: 8.7 10*3/uL — ABNORMAL HIGH (ref 1.7–7.7)
Neutrophils Relative %: 80 %
Platelets: 230 10*3/uL (ref 150–400)
RBC: 3.66 MIL/uL — ABNORMAL LOW (ref 3.87–5.11)
RDW: 14.4 % (ref 11.5–15.5)
WBC: 10.8 10*3/uL — ABNORMAL HIGH (ref 4.0–10.5)
nRBC: 0 % (ref 0.0–0.2)

## 2021-03-01 LAB — URINALYSIS, MICROSCOPIC (REFLEX): WBC, UA: >50 WBC/hpf (ref 0–5)

## 2021-03-01 LAB — URINALYSIS, ROUTINE W REFLEX MICROSCOPIC
Bilirubin Urine: NEGATIVE
Glucose, UA: NEGATIVE mg/dL
Ketones, ur: NEGATIVE mg/dL
Nitrite: NEGATIVE
Protein, ur: NEGATIVE mg/dL
Specific Gravity, Urine: <1.005 — ABNORMAL LOW (ref 1.005–1.030)
pH: 6.5 (ref 5.0–8.0)

## 2021-03-01 LAB — RESP PANEL BY RT-PCR (FLU A&B, COVID) ARPGX2
Influenza A by PCR: NEGATIVE
Influenza B by PCR: NEGATIVE
SARS Coronavirus 2 by RT PCR: NEGATIVE

## 2021-03-01 LAB — LIPASE, BLOOD: Lipase: 32 U/L (ref 11–51)

## 2021-03-01 MED ORDER — SODIUM CHLORIDE 0.9 % IV BOLUS
1000.0000 mL | Freq: Once | INTRAVENOUS | Status: AC
  Administered 2021-03-01: 1000 mL via INTRAVENOUS

## 2021-03-01 MED ORDER — CEPHALEXIN 500 MG PO CAPS
500.0000 mg | ORAL_CAPSULE | Freq: Four times a day (QID) | ORAL | 0 refills | Status: AC
Start: 2021-03-01 — End: 2021-03-08

## 2021-03-01 NOTE — Progress Notes (Signed)
Spoke with [facility] RN. The FHR is not tracing cont. She says they are adjusting the monitor.

## 2021-03-01 NOTE — ED Notes (Signed)
ED Provider at bedside. 

## 2021-03-01 NOTE — Discharge Instructions (Addendum)
You were seen in the emerge department today with runny nose and sore throat.  Your COVID test and Flu tests are negative.   I am sending antibiotic to the pharmacy to treat some bacteria in the urine.  Please follow with your OB/GYN in the coming week.

## 2021-03-01 NOTE — Progress Notes (Signed)
Received call from [facility].FHR is cont tracing now. I told her to keep the pt on the monitor for 30 min and if the tracing is reactive, I'll ask the OB if we can take it off.

## 2021-03-01 NOTE — ED Provider Notes (Signed)
Emergency Department Provider Note   I have reviewed the triage vital signs and the nursing notes.   HISTORY  Chief Complaint Nasal Congestion and Abdominal Pain   HPI Miranda Caldwell is a 35 y.o. female currently [redacted]w[redacted]d pregnant presents to the emergency department with mainly flulike symptoms over the past several days.  She is had sore throat with nasal congestion and headache.  She had some mild cough but denies any chest pain or shortness of breath.  No vaginal bleeding or discharge.  She notes some abdominal cramping in the lower abdomen bilaterally which is slightly worse on the left.  No severe pain.  No back pain.  Denies any dysuria, hesitancy, urgency.  She was fasting yesterday and has noticed some nausea.  No fevers or shaking chills.  Patient is up-to-date on her COVID vaccines.   Audio [place] interpreter used for encounter.   No past medical history on file.  Patient Active Problem List   Diagnosis Date Noted  . Supervision of normal first pregnancy, antepartum 09/02/2020  . Language barrier 09/02/2020    No past surgical history on file.  Allergies Patient has no known allergies.  Family History  Problem Relation Age of Onset  . Stroke Neg Hx   . Obesity Neg Hx   . Hypertension Neg Hx     Social History Social History   Tobacco Use  . Smoking status: Never Smoker  . Smokeless tobacco: Never Used  Vaping Use  . Vaping Use: Never used  Substance Use Topics  . Alcohol use: Never  . Drug use: Never    Review of Systems  Constitutional: No fever/chills Eyes: No visual changes. ENT: Positive congestion and sore throat.  Cardiovascular: Denies chest pain. Respiratory: Denies shortness of breath. Gastrointestinal: Cramping abdominal pain. Positive nausea and vomiting.  No diarrhea.  No constipation. Genitourinary: Negative for dysuria. Musculoskeletal: Negative for back pain. Skin: Negative for rash. Neurological: Negative for focal weakness or  numbness. Positive HA.   10-point ROS otherwise negative.  ____________________________________________   PHYSICAL EXAM:  VITAL SIGNS: ED Triage Vitals [03/01/21 1119]  Enc Vitals Group     BP 108/69     Pulse Rate 84     Resp 18     Temp 98 F (36.7 C)     Temp Source Oral     SpO2 100 %   Constitutional: Alert and oriented. Well appearing and in no acute distress. Eyes: Conjunctivae are normal.  Head: Atraumatic. Nose: No congestion/rhinnorhea. Mouth/Throat: Mucous membranes are moist.  Neck: No stridor.  Cardiovascular: Normal rate, regular rhythm. Good peripheral circulation. Grossly normal heart sounds.   Respiratory: Normal respiratory effort.  No retractions. Lungs CTAB. Gastrointestinal: Soft and nontender. No distention. Gravid abdomen.  Musculoskeletal: No lower extremity tenderness nor edema. No gross deformities of extremities. Neurologic:  Normal speech and language. No gross focal neurologic deficits are appreciated.  Skin:  Skin is warm, dry and intact. No rash noted.  ____________________________________________   LABS (all labs ordered are listed, but only abnormal results are displayed)  Labs Reviewed  URINALYSIS, ROUTINE W REFLEX MICROSCOPIC - Abnormal; Notable for the following components:      Result Value   Color, Urine STRAW (*)    Specific Gravity, Urine <1.005 (*)    Hgb urine dipstick TRACE (*)    Leukocytes,Ua LARGE (*)    All other components within normal limits  COMPREHENSIVE METABOLIC PANEL - Abnormal; Notable for the following components:   Sodium 131 (*)  CO2 20 (*)    BUN 5 (*)    Calcium 8.8 (*)    Albumin 3.1 (*)    Total Bilirubin 0.1 (*)    All other components within normal limits  CBC WITH DIFFERENTIAL/PLATELET - Abnormal; Notable for the following components:   WBC 10.8 (*)    RBC 3.66 (*)    Hemoglobin 10.9 (*)    HCT 31.3 (*)    Neutro Abs 8.7 (*)    All other components within normal limits  URINALYSIS,  MICROSCOPIC (REFLEX) - Abnormal; Notable for the following components:   Bacteria, UA MANY (*)    Non Squamous Epithelial PRESENT (*)    All other components within normal limits  RESP PANEL BY RT-PCR (FLU A&B, COVID) ARPGX2  LIPASE, BLOOD   ____________________________________________  RADIOLOGY  None ____________________________________________   PROCEDURES  Procedure(s) performed:   Procedures  None ____________________________________________   INITIAL IMPRESSION / ASSESSMENT AND PLAN / ED COURSE  Pertinent labs & imaging results that were available during my care of the patient were reviewed by me and considered in my medical decision making (see chart for details).   Patient presents to the ED at [redacted]w[redacted]d with primarily URI symptoms but endorses some generalized abdominal cramping. Patient in no distress. Vitals are WNL. Abdomen is soft a non-tender. No UTI symptoms. Patient placed on monitor and OB reviewed the strip and cleared. I do not see an emergent indication for abdominal imaging. No RLQ tenderness. Exam not consistent with acute cholecystitis. COVID and Flu are negative.   UA does show leukocytes and bacteria. Plan for Keflex and prompt OB follow up in the coming week.  ____________________________________________  FINAL CLINICAL IMPRESSION(S) / ED DIAGNOSES  Final diagnoses:  Viral URI with cough  Acute cystitis without hematuria     MEDICATIONS GIVEN DURING THIS VISIT:  Medications  sodium chloride 0.9 % bolus 1,000 mL (0 mLs Intravenous Stopped 03/01/21 1324)     NEW OUTPATIENT MEDICATIONS STARTED DURING THIS VISIT:  Discharge Medication List as of 03/01/2021  2:05 PM    START taking these medications   Details  cephALEXin (KEFLEX) 500 MG capsule Take 1 capsule (500 mg total) by mouth 4 (four) times daily for 7 days., Starting Sun 03/01/2021, Until Sun 03/08/2021, Normal        Note:  This document was prepared using Dragon voice recognition  software and may include unintentional dictation errors.  Alona Bene, MD, [facility] Emergency Medicine    Milad Bublitz, Arlyss Repress, MD 03/03/21 [phone]

## 2021-03-01 NOTE — ED Triage Notes (Addendum)
Pt presents to ED POV. Pt c/o congestion, headache, sore throat. Pt is pregnant. S/s began yesterday. Vaccinated for COVID. Pt now c/o mid abd pain described as cramping and reports that everything she eats she throws up

## 2021-03-01 NOTE — ED Notes (Signed)
Up to restroom, ambulated with min assistance, RN escorted client, gait was very steady. IVF bolus completed.

## 2021-03-01 NOTE — ED Notes (Signed)
Mother states she feels fetal movement, states she feels better after IVF administration.

## 2021-03-01 NOTE — Progress Notes (Signed)
Received call from [facility] at [facility]. Pt is 32 1/[redacted] weeks gestation, gets her care at [facility]. She presents complaining of sore throat, nasal congestion, headache. Pt has been vaccinated for covid. These symptoms began yesterday. She also is complaining of abd pain and vomiting. She does report fasting for 24 hours for religious reasons. The pt and her husband speak french and will need an interpreter.

## 2021-03-01 NOTE — Progress Notes (Signed)
Spoke with Orpha Bur at [employer]. FHR is not tracing. She says that they are trying to adjust the monitor, but the baby is moving a lot. Says they have gotten a FHR of 170 BPM and will continue to adjust the monitor.

## 2021-03-01 NOTE — Progress Notes (Signed)
Sopke with [employer] administrator. The FHR is not tracing. She says that the baby is moving and they can only get it for a second. I told her that someone may need to sit there and hold the monitor in place. We have to have a continous FHR tracing.

## 2021-03-01 NOTE — Progress Notes (Signed)
Dr. Alysia Penna on unit reviewed tracing. Pt is ob cleared. [facility] RN notified.

## 2021-03-01 NOTE — ED Notes (Signed)
AVS reviewed with client and significant other, discussed staying hydrated and make follow up OB appt. Also informed client if any concerns or not improving or feeling worse she is to call her OB or report to the ED, also informed that ED MD provided Rx for ABX secondary to UTI. Opportunity for questions provided. Denies any pain or discomfort. Cont to feel strong fetal movement

## 2021-03-01 NOTE — ED Notes (Signed)
PHONE CALL REC FROM OB [facility] RN. PER HER AND THE OB MD, TRACINGS AND CHART REVIEWED, MAY REMOVE CLIENT FROM FETAL MONITORING, SHE HAS BEEN OB CLEARED

## 2021-03-04 ENCOUNTER — Other Ambulatory Visit: Payer: Medicaid Other

## 2021-03-05 ENCOUNTER — Ambulatory Visit (INDEPENDENT_AMBULATORY_CARE_PROVIDER_SITE_OTHER): Payer: Medicaid Other | Admitting: Family Medicine

## 2021-03-05 ENCOUNTER — Other Ambulatory Visit: Payer: Self-pay

## 2021-03-05 VITALS — BP 117/82 | HR 95 | Wt 164.0 lb

## 2021-03-05 DIAGNOSIS — Z789 Other specified health status: Secondary | ICD-10-CM

## 2021-03-05 DIAGNOSIS — Z3400 Encounter for supervision of normal first pregnancy, unspecified trimester: Secondary | ICD-10-CM

## 2021-03-05 DIAGNOSIS — Z3A32 32 weeks gestation of pregnancy: Secondary | ICD-10-CM

## 2021-03-05 NOTE — Progress Notes (Signed)
Patient was seen in [facility] over the weekend. Armandina Stammer RN

## 2021-03-05 NOTE — Progress Notes (Signed)
   PRENATAL VISIT NOTE  Subjective:  Miranda Caldwell is a 35 y.o. G1P0 at [redacted]w[redacted]d being seen today for ongoing prenatal care.  She is currently monitored for the following issues for this low-risk pregnancy and has Supervision of normal first pregnancy, antepartum and Language barrier on their problem list.  Patient reports pain on right side of uterus due to fetal movement..  Contractions: Not present. Vag. Bleeding: None.  Movement: Present. Denies leaking of fluid.   The following portions of the patient's history were reviewed and updated as appropriate: allergies, current medications, past family history, past medical history, past social history, past surgical history and problem list.   Objective:   Vitals:   03/05/21 1116  BP: 117/82  Pulse: 95  Weight: 164 lb (74.4 kg)    Fetal Status: Fetal Heart Rate (bpm): 140   Movement: Present     General:  Alert, oriented and cooperative. Patient is in no acute distress.  Skin: Skin is warm and dry. No rash noted.   Cardiovascular: Normal heart rate noted  Respiratory: Normal respiratory effort, no problems with respiration noted  Abdomen: Soft, gravid, appropriate for gestational age.  Pain/Pressure: Absent     Pelvic: Cervical exam deferred        Extremities: Normal range of motion.  Edema: None  Mental Status: Normal mood and affect. Normal behavior. Normal judgment and thought content.   Assessment and Plan:  Pregnancy: G1P0 at [redacted]w[redacted]d 1. [redacted] weeks gestation of pregnancy  2. Supervision of normal first pregnancy, antepartum FHT and FH normal  3. Language barrier Interpreter used  Preterm labor symptoms and general obstetric precautions including but not limited to vaginal bleeding, contractions, leaking of fluid and fetal movement were reviewed in detail with the patient. Please refer to After Visit Summary for other counseling recommendations.   No follow-ups on file.  Future Appointments  Date Time Provider Department  Center  03/19/2021 11:15 AM Levie Heritage, DO [facility] None  04/02/2021 11:15 AM Levie Heritage, DO [facility] None  04/09/2021 11:15 AM Levie Heritage, DO [facility] None  04/15/2021 11:15 AM Levie Heritage, DO [facility] None  04/23/2021 11:15 AM Willodean Rosenthal, MD [facility] None    Levie Heritage, DO

## 2021-03-19 ENCOUNTER — Ambulatory Visit (INDEPENDENT_AMBULATORY_CARE_PROVIDER_SITE_OTHER): Payer: Medicaid Other | Admitting: Family Medicine

## 2021-03-19 ENCOUNTER — Other Ambulatory Visit: Payer: Self-pay

## 2021-03-19 VITALS — BP 115/69 | HR 88 | Wt 167.0 lb

## 2021-03-19 DIAGNOSIS — Z789 Other specified health status: Secondary | ICD-10-CM

## 2021-03-19 DIAGNOSIS — Z3400 Encounter for supervision of normal first pregnancy, unspecified trimester: Secondary | ICD-10-CM

## 2021-03-19 DIAGNOSIS — Z3A34 34 weeks gestation of pregnancy: Secondary | ICD-10-CM

## 2021-03-19 DIAGNOSIS — Z603 Acculturation difficulty: Secondary | ICD-10-CM

## 2021-03-19 NOTE — Progress Notes (Signed)
   PRENATAL VISIT NOTE  Subjective:  Miranda Caldwell is a 35 y.o. G1P0 at [redacted]w[redacted]d being seen today for ongoing prenatal care.  She is currently monitored for the following issues for this low-risk pregnancy and has Supervision of normal first pregnancy, antepartum and Language barrier on their problem list.  Patient reports no complaints.  Contractions: Not present. Vag. Bleeding: None.  Movement: Present. Denies leaking of fluid.   The following portions of the patient's history were reviewed and updated as appropriate: allergies, current medications, past family history, past medical history, past social history, past surgical history and problem list.   Objective:   Vitals:   03/19/21 1114  BP: 115/69  Pulse: 88  Weight: 167 lb (75.8 kg)    Fetal Status:     Movement: Present     General:  Alert, oriented and cooperative. Patient is in no acute distress.  Skin: Skin is warm and dry. No rash noted.   Cardiovascular: Normal heart rate noted  Respiratory: Normal respiratory effort, no problems with respiration noted  Abdomen: Soft, gravid, appropriate for gestational age.  Pain/Pressure: Absent     Pelvic: Cervical exam deferred        Extremities: Normal range of motion.  Edema: None  Mental Status: Normal mood and affect. Normal behavior. Normal judgment and thought content.   Assessment and Plan:  Pregnancy: G1P0 at [redacted]w[redacted]d 1. [redacted] weeks gestation of pregnancy FHT and FH normal  2. Supervision of normal first pregnancy, antepartum Desiring epidural during labor.  3. Language barrier Interpreter used   Preterm labor symptoms and general obstetric precautions including but not limited to vaginal bleeding, contractions, leaking of fluid and fetal movement were reviewed in detail with the patient. Please refer to After Visit Summary for other counseling recommendations.   No follow-ups on file.  Future Appointments  Date Time Provider Department Center  03/27/2021  7:30 AM  [facility] [place] [facility]  04/02/2021 11:15 AM Levie Heritage, DO [facility] None  04/09/2021 11:15 AM Levie Heritage, DO [facility] None  04/15/2021 11:15 AM Levie Heritage, DO [facility] None  04/23/2021 11:15 AM Willodean Rosenthal, MD [facility] None    Levie Heritage, DO

## 2021-03-27 ENCOUNTER — Other Ambulatory Visit: Payer: Self-pay | Admitting: Family Medicine

## 2021-03-27 ENCOUNTER — Ambulatory Visit
Admission: RE | Admit: 2021-03-27 | Discharge: 2021-03-27 | Disposition: A | Discharge status: Home / Self Care | Payer: Medicaid Other | Source: Ambulatory Visit | Attending: Family Medicine | Admitting: Family Medicine

## 2021-03-27 ENCOUNTER — Other Ambulatory Visit: Payer: Self-pay

## 2021-03-27 DIAGNOSIS — N644 Mastodynia: Secondary | ICD-10-CM

## 2021-03-27 DIAGNOSIS — Z3400 Encounter for supervision of normal first pregnancy, unspecified trimester: Secondary | ICD-10-CM

## 2021-04-01 ENCOUNTER — Other Ambulatory Visit: Payer: Self-pay

## 2021-04-01 ENCOUNTER — Ambulatory Visit
Admission: RE | Admit: 2021-04-01 | Discharge: 2021-04-01 | Disposition: A | Discharge status: Home / Self Care | Payer: Medicaid Other | Source: Ambulatory Visit | Attending: Family Medicine | Admitting: Family Medicine

## 2021-04-01 DIAGNOSIS — Z3400 Encounter for supervision of normal first pregnancy, unspecified trimester: Secondary | ICD-10-CM

## 2021-04-01 DIAGNOSIS — N644 Mastodynia: Secondary | ICD-10-CM

## 2021-04-02 ENCOUNTER — Ambulatory Visit (INDEPENDENT_AMBULATORY_CARE_PROVIDER_SITE_OTHER): Payer: Medicaid Other | Admitting: Family Medicine

## 2021-04-02 ENCOUNTER — Other Ambulatory Visit: Payer: Self-pay | Admitting: Family Medicine

## 2021-04-02 ENCOUNTER — Other Ambulatory Visit (HOSPITAL_COMMUNITY)
Admission: RE | Admit: 2021-04-02 | Discharge: 2021-04-02 | Disposition: A | Discharge status: Home / Self Care | Payer: Medicaid Other | Source: Ambulatory Visit | Attending: Family Medicine | Admitting: Family Medicine

## 2021-04-02 VITALS — BP 106/66 | HR 101 | Wt 166.0 lb

## 2021-04-02 DIAGNOSIS — Z3400 Encounter for supervision of normal first pregnancy, unspecified trimester: Secondary | ICD-10-CM

## 2021-04-02 DIAGNOSIS — Z3403 Encounter for supervision of normal first pregnancy, third trimester: Secondary | ICD-10-CM | POA: Diagnosis present

## 2021-04-02 DIAGNOSIS — Z3A36 36 weeks gestation of pregnancy: Secondary | ICD-10-CM

## 2021-04-02 DIAGNOSIS — Z789 Other specified health status: Secondary | ICD-10-CM

## 2021-04-02 NOTE — Progress Notes (Signed)
   PRENATAL VISIT NOTE  Subjective:  Miranda Caldwell is a 35 y.o. G1P0 at [redacted]w[redacted]d being seen today for ongoing prenatal care.  She is currently monitored for the following issues for this low-risk pregnancy and has Supervision of normal first pregnancy, antepartum and Language barrier on their problem list.  Patient reports no complaints.  Contractions: Not present. Vag. Bleeding: None.  Movement: Present. Denies leaking of fluid.   The following portions of the patient's history were reviewed and updated as appropriate: allergies, current medications, past family history, past medical history, past social history, past surgical history and problem list.   Objective:   Vitals:   04/02/21 1127  BP: 106/66  Pulse: (!) 101  Weight: 166 lb (75.3 kg)    Fetal Status: Fetal Heart Rate (bpm): 134   Movement: Present     General:  Alert, oriented and cooperative. Patient is in no acute distress.  Skin: Skin is warm and dry. No rash noted.   Cardiovascular: Normal heart rate noted  Respiratory: Normal respiratory effort, no problems with respiration noted  Abdomen: Soft, gravid, appropriate for gestational age.  Pain/Pressure: Absent     Pelvic: Cervical exam deferred        Extremities: Normal range of motion.  Edema: None  Mental Status: Normal mood and affect. Normal behavior. Normal judgment and thought content.   Assessment and Plan:  Pregnancy: G1P0 at [redacted]w[redacted]d 1. [redacted] weeks gestation of pregnancy FHT and FH normal. GBS today  2. Supervision of normal first pregnancy, antepartum Desiring epidural during labor.  3. Language barrier Interpreter used  4. 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 Discussed this with the patient.   Preterm labor symptoms and general obstetric precautions including but not limited to vaginal bleeding, contractions, leaking of fluid and fetal movement were reviewed in detail with the patient. Please refer to After Visit Summary for other  counseling recommendations.   No follow-ups on file.  Future Appointments  Date Time Provider Department Center  04/09/2021 11:15 AM Levie Heritage, DO [facility] None  04/15/2021 11:15 AM Levie Heritage, DO [facility] None  04/23/2021 11:15 AM Willodean Rosenthal, MD [facility] None    Levie Heritage, DO

## 2021-04-02 NOTE — Patient Instructions (Signed)
AREA PEDIATRIC/FAMILY PRACTICE PHYSICIANS  [place] ([address]) . [facility] o Chambliss, MD; Eniola, MD; Hale, MD; Hensel, MD; McDiarmid, MD; McIntyer, MD; Neal, MD; Walden, MD o [address] [place], [place] [address] o [phone] o Mon-Fri 8:30-12:30, 1:30-5:00 o Providers come to see babies at [facility] o Accepting Medicaid . [facility] o Limited providers who accept newborns: Koirala, MD; Morrow, MD; Wolters, MD o [address], [place], [place] [address] o [phone] o Mon-Fri 8:00-5:30 o Babies seen by providers at [facility] o Does NOT accept Medicaid o Please call early in hospitalization for appointment (limited availability)  . [facility] o Mulberry, MD o [address] [place], [place] [address] o [phone] o Mon, Tue, Thur, Fri 8:30-5:00, Wed 10:00-7:00 (closed 1-2pm) o Babies seen by [facility] providers o Accepting Medicaid . [facility] Pediatric[facility] o Rubin, MD o [address], [place], [place] [address] o [phone] o Mon-Fri 8:30-5:00, Sat 8:30-12:00 o Provider comes to see babies at [facility] o Accepting Medicaid o Must have been referred from current patients or contacted office prior to delivery . [facility] ([facility]) o Brown, MD; Chandler, MD; Ettefagh, MD; Grant, MD; Lester, MD; McCormick, MD; McQueen, MD; Prose, MD; Simha, MD; Stanley, MD; Stryffeler, NP; Tebben, NP o [address], [place], [place] [address] o [phone] o Mon, Tue, Thur, Fri 8:30-5:30, Wed 9:30-5:30, Sat 8:30-12:30 o Babies seen by [facility] providers o Accepting Medicaid o Only accepting infants of first-time parents or siblings of current patients o Hospital discharge coordinator will make follow-up appointment . Jack Amos o [address],  [place], [place]  [address] o [phone]   Fax - [phone] . [facility] o [address], [place], [place]  [address] o Phone - [phone]   Fax - [phone] . Shilpa Gosrani o [address], [place], [place]  [address] o [phone]  [place] ([address]) . [facility] o Bates, MD; Brassfield, MD; Cooper, Cox, MD; MD; Davis, MD; Dovico, MD; Ettefaugh, MD; Little, MD; Lowe, MD; Keiffer, MD; Melvin, MD; Sumner, MD; Williams, MD o [address], [place], [place] [address] o [phone] o Mon-Fri 8:30-5:00 (extended evenings Mon-Thur as needed), Sat-Sun 10:00-1:00 o Providers come to see babies at [facility] o Accepting Medicaid for families of first-time babies and families with all children in the household age 3 and under. Must register with office prior to making appointment (M-F only). . [facility] o Henson, NP; Knapp, MD; Lalonde, MD; Tysinger, PA o [address] [place], [place] [address] o [phone] o Mon-Fri 8:00-5:00 o Babies seen by providers at [facility] o Does NOT accept Medicaid/Commercial Insurance Only . [facility] ([facility])  o Artis, MD; Barnes, MD; Bratton, MD; Coccaro, MD; Lockett Gardner, MD; Kramer, MD; Marshall, MD; Netherton, MD; Poleto, MD; Skinner, MD o [address] [place], [place] [address] o [phone] o Mon-Fri 8:30-5:30, Sat (Oct.-Mar.) 9:00-1:00 o Babies seen by providers at [facility] o Accepting Medicaid  [place] ([address]) . [facility] o Reid, MD; Warner, MD o [address], [place], [place] [address] o [phone] o Mon-Fri 8:30-5:00, Sat 8:30-12:00 o Providers come to see babies at [facility] o Does NOT accept Medicaid . [facility] o Becker, PA; Hagler, MD; Scifres, PA; Sun, MD; Swayne, MD o [address],  [place], [place] [address] o [phone] o Mon-Fri 8:00-5:00 o Babies seen by providers at [facility] o Does NOT accept Medicaid o Only accepting babies of parents who are patients o Please call early in hospitalization for appointment (limited availability) . [facility] o Clark, MD; Frye, MD; Kelleher, MD; Mack, NP; Miller, MD; O'Keller, MD; Patterson, NP; Pudlo, MD; Puzio, MD; Thomas, MD; Tucker, MD; Twiselton, MD o [address], [place], [place] [address] o [phone] o Mon-Fri 8:00-5:00, Sat 9:00-12:00 o Providers come to see babies at [facility] o Does NOT accept Medicaid  [place] ([address]) . [facility] o Limited providers accepting new patients: Brake, NP; Wharton, PA o [address], [place], [place] [address] o [phone] o Mon-Fri 8:00-5:00 o Babies seen by providers at [facility] o Does NOT accept Medicaid o Only accepting babies of parents who are patients o Please call early in hospitalization for appointment (limited availability) . [facility] o Gay, MD; Quinlan, MD o [address] [place], [place] [address] o [phone] (press [phone] to schedule appointment) o Mon-Fri 8:00-5:00 o Providers come to see babies at [facility] o Does NOT accept Medicaid . [facility] o Mazer, MD o [address] [place], [place] [address] o [phone] o Mon-Fri 8:30-5:00 (lunch 12:30-1:00), extended hours by appointment only Wed 5:00-6:30 o Babies seen by [facility] providers o Accepting Medicaid . [facility] o Banks, MD; Jordan, MD; Koberlein, MD o [address], [place], [place] [address] o [phone] o Mon-Fri 8:00-5:00 o Babies seen by [facility] providers o Does NOT accept Medicaid . [facility] o Parker, MD; Hunter, MD; Wallace, DO o [address] [place], [place]  [address] o [phone] o Mon-Fri 8:00-5:00 o Babies seen by [facility] providers o Does NOT accept Medicaid . [facility] o Brandon, PA; Brecken, PA; Christy, NP; Dees, MD; DeClaire, MD; DeWeese, MD; Hansen, NP; Mills, NP; Parrish, NP; Smoot, NP; Summer, MD; Vapne, MD o [address] [place], [place] [address] o [phone] o Mon-Fri 8:30-5:00, Sat 10:00-1:00 o Providers come to see babies at [facility] o Does NOT accept Medicaid o Free prenatal information session Tuesdays at 4:45pm . [facility] o Bouska, MD; Gordon, PA; Jeffery, PA; Weber, PA o [address] [place] [address] o [phone] o Mon-Fri 7:30-5:30 o Babies seen by [facility] providers . [facility] o [address], [place], [place]  [address] o [phone]   Fax - [phone]  [place] ([address]) . [facility] o [place]ese, [place] o [address] [place], [place] [address] o [phone] o Mon-Thur 8:00-6:00 o Providers come to see babies at [facility] o Accepting Medicaid . [facility] o Anderson, NP; Badger, MD; Beal, PA; Spencer, PA o [address] [place], [place] [address] o [phone] o Mon-Thur 7:30-7:30, Fri 7:30-4:30 o Babies seen by [facility] providers o Accepting Medicaid . [facility] o Agbuya, MD; Klett, NP; Romgoolam, MD o [address], [place], [place] [address] o [phone] o Mon-Fri 8:30-5:00, Sat 8:30-12:00 o Providers come to see babies at [facility] o Accepting Medicaid o Must have "Meet & Greet" appointment at office prior to delivery . [facility] o McCord,   MD; Wallace, MD; Wood, MD o [address], [place], [place] [address] o [phone] o Mon-Wed 8:00-6:00, Thur-Fri 8:00-5:00, Sat 9:00-12:00 o Providers come to  see babies at [facility] o Does NOT accept Medicaid o Only accepting siblings of current patients . [facility]  o [address], [place], [place]  [address] o [phone]   Fax - [phone] . [facility] o [address], [place], [place]  [address] o [phone]   Fax - [phone]  [place] ([address]) . [facility] o Cirigliano, DO; Matthews, DO o [address] [place], [place] [address] o [phone] o Mon-Fri 7:00-5:00 o Babies seen by [facility] providers o Does NOT accept Medicaid . [facility] o Briscoe, MD; Howley, PA; Moreira, PA o [address], [place], [place] [address] o [phone] o Mon-Fri 8:00-5:00 o Babies seen by [facility] providers o Accepting Medicaid . [facility] o Boyd, MD; Church, PA; Jones, NP; Osborn, PA o [address], , [place] [address] o [phone] o Mon-Fri 8:00-5:00 o Babies seen by providers at [facility] o Accepting Medicaid  [place] ([address]) . [facility] [place] o Wendling, DO o [address] [place], [place] [address] o [phone] o Mon-Fri 8:00-5:00 o Babies seen by [facility] providers o Does NOT accept Medicaid o Limited availability, please call early in hospitalization to schedule follow-up . [facility] o Calderon, PA; Cummings, MD; Dillard, MD; Martin, PA; Olson, MD; VanDeven, PA o [address], [place], [place] [address] o [phone] o Mon-Fri 8:30-5:00, Sat 9:00-12:00 o Babies seen by providers at [facility] o Accepting Medicaid o Please register online then schedule online or call office o [email or web address] . [facility] ([facility] o Hunter, NP; Kumar, MD; Martin Rogers, PA o [address], [place], [place] [address] o [phone] o Mon-Fri 8:00-5:00 o Babies seen by providers at [facility] o Accepting Medicaid . [facility] ([facility] o [place], MD; Kristi Fleenor, NP; West, MD o [address], [place], [place] [address] o [phone] o Mon-Fri 8:00-5:30, Sat&Sun by appointment (phones open at 8:30) o Babies seen by [facility] providers o Accepting Medicaid o Must be a first-time baby or sibling of current patient . [facility] [place]  o [address], [place], [place]  [address] o [phone]   Fax - [phone]  [place] ([address]) . [facility] o Brown, PA; Cowen, PA; Rice, MD; Helton, PA; Spry, MD o [address] [place], [place] [address] o [phone] o Mon-Thur 8:00-7:00, Fri 8:00-5:00, Sat 8:00-12:00, Sun 9:00-12:00 o Babies seen by [facility] providers o Accepting Medicaid . [facility] [place] o Coe-Goins, MD; Marshall, MD; Pierre-Louis, MD o [address] [place] [address], [place],  [address] o [phone] o Mon-Thur 8:00-5:00 o Babies seen by providers at [facility] o Accepting Medicaid . [facility] o Bratton, MD; Coe-Goins, MD; Hayes, MD; Lewis, MD; List, MD; Lott, MD; Marshall, MD; Moran, MD; O'Neal, MD; Pierre-Louis, MD; Pitonzo, MD; Scholer, MD; Spangle, MD o [address] [place],    [address] o [phone] o Mon-Fri 8:00-5:30, Sat (Oct.-Mar.) 9:00-1:00 o Babies seen by providers at [facility] o Accepting Medicaid o Must fill out new patient packet, available online at [email or web address] . [facility] ([facility]) o Friddle, NP; Harris, NP; Kelly, NP; Logan, MD;  Melvin, PA; Poth, MD; Ramadoss, MD; Stanton, NP o [address], [place], [place] [address] o [phone] o Mon-Thur 8:00-5:30, Fri 8:00-5:00 o Babies seen by providers at [facility] o Accepting Medicaid  [place] ([address]) . [facility] o Dixon, PA; Frisco, MD; Pickard, MD; Tapia, PA o [address], [place], [place] [address] o [phone] o Mon-Fri 8:00-5:00 o Babies seen by providers at [facility] o Accepting Medicaid   [place] ([address]) . [facility] [place] o Masneri, DO; Meyers, MD; Nelson, PA o [address], [place], [place] [address] o [phone] o Mon-Fri 8:00-5:00 o Babies seen by providers at [facility] o Does NOT accept Medicaid o Limited appointment availability, please call early in hospitalization  . [facility] [place] o Kunedd, DO; McGowen, MD o [address], [place], [place] [address] o [phone] o Mon-Fri 8:00-5:00 o Babies seen by [facility] providers o Does NOT accept Medicaid . [facility] [place] o Cameron, MD; MacDonald, MD; Michaels, PA; Nayak, MD o [address] [place] [address], [place], [place] [address] o [phone] o Mon-Fri 8:00-5:00 o After hours clinic ([address] [place], [place] [address]) [phone] Mon-Fri 5:00-8:00, Sat 12:00-6:00, Sun 10:00-4:00 o Babies seen by [facility] providers o Accepting Medicaid . [facility] [place] o [address], [place], [place]  [address] o [phone]   Fax - [phone]  [place] ([address]) . [facility] [place] [facility] o Andy, MD o [address], [place], [place] [address] o [phone] o Mon-Fri 8:00-5:00 o Babies seen by [facility] providers o Does NOT accept Medicaid . [facility] [place] ([facility] [place]) o Eksir, MD o [address], [place], [place]  [address] o [phone] o Mon-Thur 8:00-7:00, Fri 8:00-5:00, Sat 8:00-12:00 o Babies seen by providers at [facility] o Accepting Medicaid - but does not have vaccinations in office (must be received elsewhere) o Limited availability, please call early in hospitalization  [place] ([address]) . [facility]  o Charlene Flemming, MD o [address], [place] [address] o [phone]  Fax [phone]   

## 2021-04-06 ENCOUNTER — Encounter: Payer: Self-pay | Admitting: Family Medicine

## 2021-04-06 DIAGNOSIS — B951 Streptococcus, group B, as the cause of diseases classified elsewhere: Secondary | ICD-10-CM | POA: Insufficient documentation

## 2021-04-06 LAB — MOLECULAR ANCILLARY ONLY
Chlamydia: NEGATIVE
Comment: NEGATIVE
Comment: NORMAL
Neisseria Gonorrhea: NEGATIVE

## 2021-04-06 LAB — CULTURE, BETA STREP (GROUP B ONLY): Strep Gp B Culture: POSITIVE — AB

## 2021-04-09 ENCOUNTER — Ambulatory Visit (INDEPENDENT_AMBULATORY_CARE_PROVIDER_SITE_OTHER): Payer: Medicaid Other | Admitting: Family Medicine

## 2021-04-09 ENCOUNTER — Other Ambulatory Visit: Payer: Self-pay

## 2021-04-09 VITALS — BP 118/70 | HR 92 | Wt 170.0 lb

## 2021-04-09 DIAGNOSIS — Z3400 Encounter for supervision of normal first pregnancy, unspecified trimester: Secondary | ICD-10-CM

## 2021-04-09 DIAGNOSIS — B951 Streptococcus, group B, as the cause of diseases classified elsewhere: Secondary | ICD-10-CM

## 2021-04-09 DIAGNOSIS — Z789 Other specified health status: Secondary | ICD-10-CM

## 2021-04-09 DIAGNOSIS — Z3A37 37 weeks gestation of pregnancy: Secondary | ICD-10-CM

## 2021-04-09 MED ORDER — TERCONAZOLE 0.4 % VA CREA: 1.0000 | TOPICAL_CREAM | Freq: Every day | VAGINAL | 0 refills | Status: DC

## 2021-04-09 NOTE — Patient Instructions (Signed)
For Constipation: -Miralax: 1 packet or 1 cap full mixed with juice or water twice a day until using the bathroom  For Hemorrhoids: -Preparation H: 2-3 times daily until hemorrhoids are gone

## 2021-04-09 NOTE — Progress Notes (Signed)
Language resources interpreter Miranda Caldwell

## 2021-04-09 NOTE — Progress Notes (Signed)
   PRENATAL VISIT NOTE  Subjective:  Miranda Caldwell is a 35 y.o. G1P0 at [redacted]w[redacted]d being seen today for ongoing prenatal care.  She is currently monitored for the following issues for this low-risk pregnancy and has Supervision of normal first pregnancy, antepartum; Language barrier; and Positive GBS test on their problem list.  Patient reports constipation, hemorrhoids, external vaginal irritation.  Contractions: Not present. Vag. Bleeding: None.  Movement: Present. Denies leaking of fluid.   The following portions of the patient's history were reviewed and updated as appropriate: allergies, current medications, past family history, past medical history, past social history, past surgical history and problem list.   Objective:   Vitals:   04/09/21 1116  BP: 118/70  Pulse: 92  Weight: 170 lb (77.1 kg)    Fetal Status: Fetal Heart Rate (bpm): 141 Fundal Height: 38 cm Movement: Present     General:  Alert, oriented and cooperative. Patient is in no acute distress.  Skin: Skin is warm and dry. No rash noted.   Cardiovascular: Normal heart rate noted  Respiratory: Normal respiratory effort, no problems with respiration noted  Abdomen: Soft, gravid, appropriate for gestational age.  Pain/Pressure: Present     Pelvic: Cervical exam deferred        Extremities: Normal range of motion.  Edema: Trace  Mental Status: Normal mood and affect. Normal behavior. Normal judgment and thought content.   Assessment and Plan:  Pregnancy: G1P0 at [redacted]w[redacted]d 1. [redacted] weeks gestation of pregnancy  2. Supervision of normal first pregnancy, antepartum FHT and FH normal Miralax for constipation, preparation H for hemorrhoids, and terazol for vaginal irritation  3. Language barrier Interpreter used  4. Positive GBS test Intrapartum PPx   Term labor symptoms and general obstetric precautions including but not limited to vaginal bleeding, contractions, leaking of fluid and fetal movement were reviewed in  detail with the patient. Please refer to After Visit Summary for other counseling recommendations.   No follow-ups on file.  Future Appointments  Date Time Provider Department Center  04/15/2021 11:15 AM Levie Heritage, DO [facility] None  04/23/2021 11:15 AM Willodean Rosenthal, MD [facility] None    Levie Heritage, DO

## 2021-04-15 ENCOUNTER — Other Ambulatory Visit: Payer: Self-pay

## 2021-04-15 ENCOUNTER — Ambulatory Visit (INDEPENDENT_AMBULATORY_CARE_PROVIDER_SITE_OTHER): Payer: Medicaid Other | Admitting: Family Medicine

## 2021-04-15 VITALS — BP 110/68 | HR 99 | Wt 168.0 lb

## 2021-04-15 DIAGNOSIS — Z789 Other specified health status: Secondary | ICD-10-CM

## 2021-04-15 DIAGNOSIS — Z3400 Encounter for supervision of normal first pregnancy, unspecified trimester: Secondary | ICD-10-CM

## 2021-04-15 DIAGNOSIS — B951 Streptococcus, group B, as the cause of diseases classified elsewhere: Secondary | ICD-10-CM

## 2021-04-15 DIAGNOSIS — Z3A38 38 weeks gestation of pregnancy: Secondary | ICD-10-CM

## 2021-04-15 NOTE — Progress Notes (Signed)
Language Resources interpreter Jocelyne. 

## 2021-04-15 NOTE — Progress Notes (Signed)
   PRENATAL VISIT NOTE  Subjective:  Miranda Caldwell is a 35 y.o. G1P0 at [redacted]w[redacted]d being seen today for ongoing prenatal care.  She is currently monitored for the following issues for this low-risk pregnancy and has Supervision of normal first pregnancy, antepartum; Language barrier; and Positive GBS test on their problem list.  Patient reports occasional contractions.  Contractions: Not present. Vag. Bleeding: None.  Movement: Present. Denies leaking of fluid.   The following portions of the patient's history were reviewed and updated as appropriate: allergies, current medications, past family history, past medical history, past social history, past surgical history and problem list.   Objective:   Vitals:   04/15/21 1124  BP: 110/68  Pulse: 99  Weight: 168 lb (76.2 kg)    Fetal Status: Fetal Heart Rate (bpm): 157 Fundal Height: 39 cm Movement: Present     General:  Alert, oriented and cooperative. Patient is in no acute distress.  Skin: Skin is warm and dry. No rash noted.   Cardiovascular: Normal heart rate noted  Respiratory: Normal respiratory effort, no problems with respiration noted  Abdomen: Soft, gravid, appropriate for gestational age.  Pain/Pressure: Present     Pelvic: Cervical exam deferred        Extremities: Normal range of motion.  Edema: None  Mental Status: Normal mood and affect. Normal behavior. Normal judgment and thought content.   Assessment and Plan:  Pregnancy: G1P0 at [redacted]w[redacted]d 1. [redacted] weeks gestation of pregnancy  2. Supervision of normal first pregnancy, antepartum FHT and FH normal  3. Positive GBS test intrpartum PPx  4. Language barrier Interpreter used  Term labor symptoms and general obstetric precautions including but not limited to vaginal bleeding, contractions, leaking of fluid and fetal movement were reviewed in detail with the patient. Please refer to After Visit Summary for other counseling recommendations.   No follow-ups on  file.  Future Appointments  Date Time Provider Department Center  04/23/2021 11:15 AM Willodean Rosenthal, MD [facility] None    Levie Heritage, DO

## 2021-04-16 ENCOUNTER — Encounter: Payer: Medicaid Other | Admitting: Obstetrics & Gynecology

## 2021-04-23 ENCOUNTER — Ambulatory Visit (INDEPENDENT_AMBULATORY_CARE_PROVIDER_SITE_OTHER): Payer: Medicaid Other | Admitting: Obstetrics & Gynecology

## 2021-04-23 ENCOUNTER — Encounter: Payer: Self-pay | Admitting: Obstetrics & Gynecology

## 2021-04-23 ENCOUNTER — Other Ambulatory Visit: Payer: Self-pay

## 2021-04-23 VITALS — BP 121/70 | HR 81 | Wt 170.0 lb

## 2021-04-23 DIAGNOSIS — Z789 Other specified health status: Secondary | ICD-10-CM

## 2021-04-23 DIAGNOSIS — Z3400 Encounter for supervision of normal first pregnancy, unspecified trimester: Secondary | ICD-10-CM

## 2021-04-23 DIAGNOSIS — B951 Streptococcus, group B, as the cause of diseases classified elsewhere: Secondary | ICD-10-CM

## 2021-04-23 NOTE — Progress Notes (Signed)
   PRENATAL VISIT NOTE  Subjective:  Miranda Caldwell is a 35 y.o. G1P0 at [redacted]w[redacted]d being seen today for ongoing prenatal care.  She is currently monitored for the following issues for this low-risk pregnancy and has Supervision of normal first pregnancy, antepartum; Language barrier; and Positive GBS test on their problem list.  Patient reports no complaints.  Contractions: Not present. Vag. Bleeding: None.  Movement: Present. Denies leaking of fluid.   The following portions of the patient's history were reviewed and updated as appropriate: allergies, current medications, past family history, past medical history, past social history, past surgical history and problem list.   Objective:   Vitals:   04/23/21 1112  BP: 121/70  Pulse: 81  Weight: 170 lb (77.1 kg)    Fetal Status: Fetal Heart Rate (bpm): 143 Fundal Height: 39 cm Movement: Present  Presentation: Vertex  General:  Alert, oriented and cooperative. Patient is in no acute distress.  Skin: Skin is warm and dry. No rash noted.   Cardiovascular: Normal heart rate noted  Respiratory: Normal respiratory effort, no problems with respiration noted  Abdomen: Soft, gravid, appropriate for gestational age.  Pain/Pressure: Present     Pelvic: Cervical exam performed in the presence of a chaperone Dilation: 1 Effacement (%): 60 Station: Ballotable  Extremities: Normal range of motion.  Edema: Trace  Mental Status: Normal mood and affect. Normal behavior. Normal judgment and thought content.   Assessment and Plan:  Pregnancy: G1P0 at [redacted]w[redacted]d 1. Supervision of normal first pregnancy, antepartum FH and FHR WNL Reviewed labor and location of delivery Reviewed pain meds in labor.  Pt desires epidural   2. Language barrier  [place] interpreter used for entire visit.    3. Positive GBS test Needs atbx in labor   Term labor symptoms and general obstetric precautions including but not limited to vaginal bleeding, contractions, leaking of  fluid and fetal movement were reviewed in detail with the patient. Please refer to After Visit Summary for other counseling recommendations.   No follow-ups on file.  Future Appointments  Date Time Provider Department Center  04/29/2021 10:45 AM Reva Bores, MD [facility] None    Willodean Rosenthal, MD

## 2021-04-23 NOTE — Progress Notes (Signed)
+   fetal movement. No complaints. Pt would like to have cervical check today

## 2021-04-27 ENCOUNTER — Inpatient Hospital Stay (HOSPITAL_COMMUNITY): Payer: Medicaid Other | Admitting: Anesthesiology

## 2021-04-27 ENCOUNTER — Encounter (HOSPITAL_COMMUNITY): Payer: Self-pay | Admitting: *Deleted

## 2021-04-27 ENCOUNTER — Other Ambulatory Visit: Payer: Self-pay

## 2021-04-27 ENCOUNTER — Inpatient Hospital Stay (HOSPITAL_COMMUNITY)
Admission: AD | Admit: 2021-04-27 | Discharge: 2021-04-30 | DRG: 807 | Disposition: A | Discharge status: Home / Self Care | Payer: Medicaid Other | Attending: Family Medicine | Admitting: Family Medicine

## 2021-04-27 DIAGNOSIS — Z789 Other specified health status: Secondary | ICD-10-CM | POA: Diagnosis present

## 2021-04-27 DIAGNOSIS — Z20822 Contact with and (suspected) exposure to covid-19: Secondary | ICD-10-CM | POA: Diagnosis present

## 2021-04-27 DIAGNOSIS — O26893 Other specified pregnancy related conditions, third trimester: Secondary | ICD-10-CM | POA: Diagnosis present

## 2021-04-27 DIAGNOSIS — Z3493 Encounter for supervision of normal pregnancy, unspecified, third trimester: Secondary | ICD-10-CM

## 2021-04-27 DIAGNOSIS — O99824 Streptococcus B carrier state complicating childbirth: Principal | ICD-10-CM | POA: Diagnosis present

## 2021-04-27 DIAGNOSIS — Z3A40 40 weeks gestation of pregnancy: Secondary | ICD-10-CM

## 2021-04-27 LAB — CBC
HCT: 38.1 % (ref 36.0–46.0)
Hemoglobin: 12.9 g/dL (ref 12.0–15.0)
MCH: 30 pg (ref 26.0–34.0)
MCHC: 33.9 g/dL (ref 30.0–36.0)
MCV: 88.6 fL (ref 80.0–100.0)
Platelets: 237 10*3/uL (ref 150–400)
RBC: 4.3 MIL/uL (ref 3.87–5.11)
RDW: 15.5 % (ref 11.5–15.5)
WBC: 11.4 10*3/uL — ABNORMAL HIGH (ref 4.0–10.5)
nRBC: 0 % (ref 0.0–0.2)

## 2021-04-27 LAB — RESP PANEL BY RT-PCR (FLU A&B, COVID) ARPGX2
Influenza A by PCR: NEGATIVE
Influenza B by PCR: NEGATIVE
SARS Coronavirus 2 by RT PCR: NEGATIVE

## 2021-04-27 LAB — TYPE AND SCREEN
ABO/RH(D): A POS
Antibody Screen: NEGATIVE

## 2021-04-27 MED ORDER — FENTANYL CITRATE (PF) 100 MCG/2ML IJ SOLN
100.0000 ug | INTRAMUSCULAR | Status: DC | PRN
Start: 2021-04-27 — End: 2021-04-28

## 2021-04-27 MED ORDER — OXYTOCIN-SODIUM CHLORIDE 30-0.9 UT/500ML-% IV SOLN
2.5000 [IU]/h | INTRAVENOUS | Status: DC
  Filled 2021-04-27: qty 500

## 2021-04-27 MED ORDER — LIDOCAINE HCL (PF) 1 % IJ SOLN: 30.0000 mL | INTRAMUSCULAR | Status: DC | PRN

## 2021-04-27 MED ORDER — OXYTOCIN BOLUS FROM INFUSION
333.0000 mL | Freq: Once | INTRAVENOUS | Status: AC
  Administered 2021-04-28: 333 mL via INTRAVENOUS

## 2021-04-27 MED ORDER — PENICILLIN G POT IN DEXTROSE 60000 UNIT/ML IV SOLN
3.0000 10*6.[IU] | INTRAVENOUS | Status: DC
  Administered 2021-04-28 (×2): 3 10*6.[IU] via INTRAVENOUS
  Filled 2021-04-27 (×2): qty 50

## 2021-04-27 MED ORDER — LIDOCAINE-EPINEPHRINE (PF) 2 %-1:200000 IJ SOLN
INTRAMUSCULAR | Status: DC | PRN
  Administered 2021-04-27: 4 mL via EPIDURAL

## 2021-04-27 MED ORDER — DIPHENHYDRAMINE HCL 50 MG/ML IJ SOLN: 12.5000 mg | INTRAMUSCULAR | Status: DC | PRN

## 2021-04-27 MED ORDER — ONDANSETRON HCL 4 MG/2ML IJ SOLN: 4.0000 mg | Freq: Four times a day (QID) | INTRAMUSCULAR | Status: DC | PRN

## 2021-04-27 MED ORDER — PHENYLEPHRINE 40 MCG/ML (10ML) SYRINGE FOR IV PUSH (FOR BLOOD PRESSURE SUPPORT): 80.0000 ug | PREFILLED_SYRINGE | INTRAVENOUS | Status: DC | PRN

## 2021-04-27 MED ORDER — SODIUM CHLORIDE 0.9 % IV SOLN
5.0000 10*6.[IU] | Freq: Once | INTRAVENOUS | Status: AC
  Administered 2021-04-27: 5 10*6.[IU] via INTRAVENOUS
  Filled 2021-04-27: qty 5

## 2021-04-27 MED ORDER — EPHEDRINE 5 MG/ML INJ: 10.0000 mg | INTRAVENOUS | Status: DC | PRN

## 2021-04-27 MED ORDER — FENTANYL-BUPIVACAINE-NACL 0.5-0.125-0.9 MG/250ML-% EP SOLN
12.0000 mL/h | EPIDURAL | Status: DC | PRN
  Administered 2021-04-27: 12 mL/h via EPIDURAL
  Filled 2021-04-27: qty 250

## 2021-04-27 MED ORDER — LACTATED RINGERS IV SOLN: 500.0000 mL | INTRAVENOUS | Status: DC | PRN

## 2021-04-27 MED ORDER — LACTATED RINGERS IV SOLN: INTRAVENOUS | Status: DC

## 2021-04-27 MED ORDER — SOD CITRATE-CITRIC ACID 500-334 MG/5ML PO SOLN: 30.0000 mL | ORAL | Status: DC | PRN

## 2021-04-27 MED ORDER — OXYCODONE-ACETAMINOPHEN 5-325 MG PO TABS
1.0000 | ORAL_TABLET | ORAL | Status: DC | PRN
Start: 2021-04-27 — End: 2021-04-28

## 2021-04-27 MED ORDER — ACETAMINOPHEN 325 MG PO TABS: 650.0000 mg | ORAL_TABLET | ORAL | Status: DC | PRN

## 2021-04-27 MED ORDER — OXYCODONE-ACETAMINOPHEN 5-325 MG PO TABS
2.0000 | ORAL_TABLET | ORAL | Status: DC | PRN
Start: 2021-04-27 — End: 2021-04-28

## 2021-04-27 MED ORDER — LACTATED RINGERS IV SOLN
500.0000 mL | Freq: Once | INTRAVENOUS | Status: AC
  Administered 2021-04-27: 500 mL via INTRAVENOUS

## 2021-04-27 NOTE — Anesthesia Procedure Notes (Signed)
Epidural Patient location during procedure: OB Start time: 04/27/2021 11:15 PM End time: 04/27/2021 11:25 PM  Staffing Anesthesiologist: Elmer Picker, MD Performed: anesthesiologist   Preanesthetic Checklist Completed: patient identified, IV checked, risks and benefits discussed, monitors and equipment checked, pre-op evaluation and timeout performed  Epidural Patient position: sitting Prep: DuraPrep and site prepped and draped Patient monitoring: continuous pulse ox, blood pressure, heart rate and cardiac monitor Approach: midline Location: L3-L4 Injection technique: LOR air  Needle:  Needle type: Tuohy  Needle gauge: 17 G Needle length: 9 cm Needle insertion depth: 4 cm Catheter type: closed end flexible Catheter size: 19 Gauge Catheter at skin depth: 10 cm Test dose: negative  Assessment Sensory level: T8 Events: blood not aspirated, injection not painful, no injection resistance, no paresthesia and negative IV test  Additional Notes Patient identified. Risks/Benefits/Options discussed with patient including but not limited to bleeding, infection, nerve damage, paralysis, failed block, incomplete pain control, headache, blood pressure changes, nausea, vomiting, reactions to medication both or allergic, itching and postpartum back pain. Confirmed with bedside nurse the patient's most recent platelet count. Confirmed with patient that they are not currently taking any anticoagulation, have any bleeding history or any family history of bleeding disorders. Patient expressed understanding and wished to proceed. All questions were answered. Sterile technique was used throughout the entire procedure. Please see nursing notes for vital signs. Test dose was given through epidural catheter and negative prior to continuing to dose epidural or start infusion. Warning signs of high block given to the patient including shortness of breath, tingling/numbness in hands, complete motor block,  or any concerning symptoms with instructions to call for help. Patient was given instructions on fall risk and not to get out of bed. All questions and concerns addressed with instructions to call with any issues or inadequate analgesia.  Reason for block:procedure for pain

## 2021-04-27 NOTE — H&P (Signed)
OBSTETRIC ADMISSION HISTORY AND PHYSICAL  Miranda Caldwell is a 35 y.o. female G1P0 with IUP at [redacted]w[redacted]d by L/7 presenting for spontaneous onset of labor. She reports +FMs, No LOF, no VB, no blurry vision, headaches or peripheral edema, and RUQ pain.  She plans on breast feeding. She is undecided for birth control. She received her prenatal [facility] at [facility]   Dating: By L/7 --->  Estimated Date of Delivery: 04/25/21  Sono:  @[redacted]w[redacted]d , CWD, normal anatomy, cephalic presentation, 813g, 68% EFW   Prenatal History/Complications:  - Language barrier - GBS positive status  Past Medical History: None reported  Past Surgical History: None reported.  Obstetrical History: OB History    Gravida  1   Para      Term      Preterm      AB      Living        SAB      IAB      Ectopic      Multiple      Live Births              Social History Social History   Socioeconomic History  . Marital status: Married    Spouse name: Not on file  . Number of children: Not on file  . Years of education: Not on file  . Highest education level: Not on file  Occupational History  . Not on file  Tobacco Use  . Smoking status: Never Smoker  . Smokeless tobacco: Never Used  Vaping Use  . Vaping Use: Never used  Substance and Sexual Activity  . Alcohol use: Never  . Drug use: Never  . Sexual activity: Yes  Other Topics Concern  . Not on file  Social History Narrative  . Not on file   Social Determinants of Health   Financial Resource Strain: Not on file  Food Insecurity: Not on file  Transportation Needs: Not on file  Physical Activity: Not on file  Stress: Not on file  Social Connections: Not on file    Family History: Family History  Problem Relation Age of Onset  . Stroke Neg Hx   . Obesity Neg Hx   . Hypertension Neg Hx     Allergies: No Known Allergies  Medications Prior to Admission  Medication Sig Dispense Refill Last Dose  . ferrous sulfate (FERROUSUL)  325 (65 FE) MG tablet Take 1 tablet (325 mg total) by mouth every other day. 60 tablet 1   . Prenatal Vit-Fe Fumarate-FA (PRENATAL MULTIVITAMIN) TABS tablet Take 1 tablet by mouth daily at 12 noon.        Review of Systems   All systems reviewed and negative except as stated in HPI  Blood pressure 119/71, pulse 100, temperature 98.7 F (37.1 C), temperature source Oral, resp. rate 15, last menstrual period 07/19/2020, SpO2 100 %. General appearance: alert, cooperative and appears stated age Lungs: normal WOB Heart: regular rate Abdomen: soft, non-tender Extremities: no sign of DVT Presentation: cephalic Fetal monitoringBaseline: 130 bpm, Variability: Good {> 6 bpm), Accelerations: Reactive and Decelerations: Absent Uterine activityFrequency: Every 2-4 minutes Dilation: 3 Effacement (%): 90 Station: - 1 Exam by:: [email or web address] RN   Prenatal labs: ABO, Rh: --/--/A POS (05/30 2214) Antibody: NEG (05/30 2214) Rubella: 4.34 (10/05 0926) RPR: Non Reactive (03/10 0913)  HBsAg: Negative (10/05 0926)  HIV: Non Reactive (03/10 0913)  GBS: Positive/-- (05/05 1201)  2 hr Glucola wnl Genetic screening declined Anatomy 12-18-2001 wnl  Prenatal  Transfer Tool  Maternal Diabetes: No Genetic Screening: Declined Maternal Ultrasounds/Referrals: Normal Fetal Ultrasounds or other Referrals:  None Maternal Substance Abuse:  No Significant Maternal Medications:  None Significant Maternal Lab Results: Group B Strep positive  Results for orders placed or performed during the hospital encounter of 04/27/21 (from the past 24 hour(s))  CBC   Collection Time: 04/27/21  9:57 PM  Result Value Ref Range   WBC 11.4 (H) 4.0 - 10.5 K/uL   RBC 4.30 3.87 - 5.11 MIL/uL   Hemoglobin 12.9 12.0 - 15.0 g/dL   HCT 41.7 40.8 - 14.4 %   MCV 88.6 80.0 - 100.0 fL   MCH 30.0 26.0 - 34.0 pg   MCHC 33.9 30.0 - 36.0 g/dL   RDW 81.8 56.3 - 14.9 %   Platelets 237 150 - 400 K/uL   nRBC 0.0 0.0 - 0.2 %  Type and screen [facility]   Collection Time: 04/27/21 10:14 PM  Result Value Ref Range   ABO/RH(D) A POS    Antibody Screen NEG    Sample Expiration      04/30/2021,2359 Performed at [facility], [address], [place], [place] [address]   Resp Panel by RT-PCR (Flu A&B, Covid) Nasopharyngeal Swab   Collection Time: 04/27/21 10:20 PM   Specimen: Nasopharyngeal Swab; Nasopharyngeal(NP) swabs in vial transport medium  Result Value Ref Range   SARS Coronavirus 2 by RT PCR NEGATIVE NEGATIVE   Influenza A by PCR NEGATIVE NEGATIVE   Influenza B by PCR NEGATIVE NEGATIVE    Patient Active Problem List   Diagnosis Date Noted  . Supervision of low-risk pregnancy, third trimester 04/27/2021  . Positive GBS test 04/06/2021  . Supervision of normal first pregnancy, antepartum 09/02/2020  . Language barrier 09/02/2020    Assessment/Plan:  Miranda Caldwell is a 35 y.o. G1P0 at [redacted]w[redacted]d here for spontaneous onset of labor.  #Labor: Will manage expectantly and augment as clinically indicated. #Pain: TBD per pt request #FWB: Category 1 strip #ID: GBS positive; start PCN on admission #MOF: breast #MOC: undecided #Circ: n/a  Sheila Oats, MD  04/28/2021, 4:10 AM

## 2021-04-27 NOTE — MAU Note (Signed)
Pt reports ctx started this morning. Have gradually gotten worse in pain and closer together, about 2-5 minutes. Denies VB and LOF. Reports +FM. GBS+

## 2021-04-27 NOTE — Anesthesia Preprocedure Evaluation (Signed)

## 2021-04-28 ENCOUNTER — Encounter (HOSPITAL_COMMUNITY): Payer: Self-pay | Admitting: Obstetrics and Gynecology

## 2021-04-28 DIAGNOSIS — Z3A40 40 weeks gestation of pregnancy: Secondary | ICD-10-CM

## 2021-04-28 DIAGNOSIS — O99824 Streptococcus B carrier state complicating childbirth: Secondary | ICD-10-CM

## 2021-04-28 LAB — RPR: RPR Ser Ql: NONREACTIVE

## 2021-04-28 MED ORDER — WITCH HAZEL-GLYCERIN EX PADS
1.0000 | MEDICATED_PAD | CUTANEOUS | Status: DC | PRN
Start: 2021-04-28 — End: 2021-04-30

## 2021-04-28 MED ORDER — PRENATAL MULTIVITAMIN CH
1.0000 | ORAL_TABLET | Freq: Every day | ORAL | Status: DC
  Administered 2021-04-28 – 2021-04-30 (×3): 1 via ORAL
  Filled 2021-04-28 (×3): qty 1

## 2021-04-28 MED ORDER — BENZOCAINE-MENTHOL 20-0.5 % EX AERO
1.0000 "application " | INHALATION_SPRAY | CUTANEOUS | Status: DC | PRN
  Administered 2021-04-28: 1 via TOPICAL
  Filled 2021-04-28: qty 56

## 2021-04-28 MED ORDER — ONDANSETRON HCL 4 MG/2ML IJ SOLN: 4.0000 mg | INTRAMUSCULAR | Status: DC | PRN

## 2021-04-28 MED ORDER — TETANUS-DIPHTH-ACELL PERTUSSIS 5-2.5-18.5 LF-MCG/0.5 IM SUSY: 0.5000 mL | PREFILLED_SYRINGE | Freq: Once | INTRAMUSCULAR | Status: DC

## 2021-04-28 MED ORDER — ONDANSETRON HCL 4 MG PO TABS: 4.0000 mg | ORAL_TABLET | ORAL | Status: DC | PRN

## 2021-04-28 MED ORDER — IBUPROFEN 600 MG PO TABS
600.0000 mg | ORAL_TABLET | Freq: Four times a day (QID) | ORAL | Status: DC
  Administered 2021-04-28 – 2021-04-30 (×9): 600 mg via ORAL
  Filled 2021-04-28 (×9): qty 1

## 2021-04-28 MED ORDER — ACETAMINOPHEN 325 MG PO TABS
650.0000 mg | ORAL_TABLET | Freq: Four times a day (QID) | ORAL | Status: DC
  Administered 2021-04-28 – 2021-04-30 (×8): 650 mg via ORAL
  Filled 2021-04-28 (×8): qty 2

## 2021-04-28 MED ORDER — COCONUT OIL OIL: 1.0000 "application " | TOPICAL_OIL | Status: DC | PRN

## 2021-04-28 MED ORDER — DIPHENHYDRAMINE HCL 25 MG PO CAPS: 25.0000 mg | ORAL_CAPSULE | Freq: Four times a day (QID) | ORAL | Status: DC | PRN

## 2021-04-28 MED ORDER — TRANEXAMIC ACID-NACL 1000-0.7 MG/100ML-% IV SOLN
INTRAVENOUS | Status: AC
  Filled 2021-04-28: qty 100

## 2021-04-28 MED ORDER — DIBUCAINE (PERIANAL) 1 % EX OINT: 1.0000 "application " | TOPICAL_OINTMENT | CUTANEOUS | Status: DC | PRN

## 2021-04-28 MED ORDER — SENNOSIDES-DOCUSATE SODIUM 8.6-50 MG PO TABS
2.0000 | ORAL_TABLET | Freq: Every day | ORAL | Status: DC
  Administered 2021-04-29 – 2021-04-30 (×2): 2 via ORAL
  Filled 2021-04-28 (×2): qty 2

## 2021-04-28 MED ORDER — TRANEXAMIC ACID-NACL 1000-0.7 MG/100ML-% IV SOLN
1000.0000 mg | INTRAVENOUS | Status: AC
  Administered 2021-04-28: 1000 mg via INTRAVENOUS

## 2021-04-28 MED ORDER — SIMETHICONE 80 MG PO CHEW: 80.0000 mg | CHEWABLE_TABLET | ORAL | Status: DC | PRN

## 2021-04-28 NOTE — Progress Notes (Signed)
Labor Progress Note Elaynah Virginia is a 35 y.o. G1P0 at [redacted]w[redacted]d presented for spontaneous onset of labor.  S: Pt resting comfortable with epidural in place. No concerns at this time.  O:  BP 119/71   Pulse 100   Temp 98.7 F (37.1 C) (Oral)   Resp 15   LMP 07/19/2020   SpO2 100%  EFM: baseline 140/moderate variability/+accels/no decels Toco: contractions every 2-3 min  CVE: Dilation: 9 Effacement (%): 90 Station: Plus 1 Presentation: Vertex Exam by:: Khaliya Golinski MD   A&P: 35 y.o. G1P0 [redacted]w[redacted]d presented for spontaneous onset of labor. #Labor: Progressing well. Now s/p AROM for clear fluid at 0400. #Pain: epidural in place #FWB: Category 1 strip #GBS positive; now s/p adequate PCN  Sheila Oats, MD 4:11 AM

## 2021-04-28 NOTE — Lactation Note (Signed)
This note was copied from a baby's chart. Lactation Consultation Note  Patient Name: Miranda Caldwell GYBWL'S Date: 04/28/2021 Reason for consult: Follow-up assessment Age:34 hours Mom sitting in chair, FOB sitting on couch, baby asleep on back in bassinet. Mom requests FOB as [place] interpreter. Mom within inverted nipples bilat, reports with nipple pain bilat. Reports able to latch baby with nipple shield (40mm, largest size available). Mom has WIC ([place] encouraged mom to f/u upon discharge for BF support and check availability for DEBP.  Nipples free of damage bilat, bruise like mark noted to left areola at 12 o'clock location, mom states this mark has always been there and not r/t breastfeeding. Mom set up with DEBP, reviewed setup, frequency and milk storage. LC changed moderate stool diaper (dark green). Baby up to right breast with NS, unable to latch to left. Unsuccessful attempted to latching to right without NS. Mom c/o pain but requests to keep nursing. Audible swallows and breast tissue movement noted.   Discussed cue based feedings, expect 8-12 feeds q24hrs, wake if >4hrs since last feeding, hand pump to help ever nipples, DEBP after feeds for stimulation, skin to skin, f/u with [facility] upon discharge. Advised mom to request coconut oil from RN. Parents voiced understanding and with no further concerns. Left the room with baby still latched to right breast at ~58min mark. BGilliam, RN, IBCLC  Maternal Data Has patient been taught Hand Expression?: Yes Does the patient have breastfeeding experience prior to this delivery?: No  Feeding Mother's Current Feeding Choice: Breast Milk and Formula Nipple Type: Slow - flow  LATCH Score= 5                    Lactation Tools Discussed/Used Nipple shield size: 20 Breast pump type: Double-Electric Breast Pump Reason for Pumping: stimulation d/t nipple shield  Interventions    Discharge WIC Program: Yes  Consult  Status Consult Status: Follow-up Date: 04/29/21 Follow-up type: In-patient    Charlynn Court 04/28/2021, 7:54 PM

## 2021-04-28 NOTE — Discharge Instructions (Signed)

## 2021-04-28 NOTE — Lactation Note (Signed)
This note was copied from a baby's chart. Lactation Consultation Note  Patient Name: Miranda Caldwell IWLNL'G Date: 04/28/2021 Reason for consult: L&D Initial assessment Age:34 hours   P1, Mother seen in L&D, Mother speaks [place]. Father at the bedside and reports that he will interpret. Mother doing STS. Observed that mother has inverted nipples.  Assist with hand expression and observed large drops of colostrum.  Mothers nipple becomes erect with stimulation but them retracts quickly when infant placed to breast.  Infant took a few shallow sucks. Repeated attempts to latch infant in several positions. No sustained latch.  Informed mother to continue to try to latch and do frequent STS. Mother informed that we would use and hand pump and possible nipple shield if needed.  Mother made aware of available of available services when she gets to the floor.   Maternal Data Has patient been taught Hand Expression?: Yes Does the patient have breastfeeding experience prior to this delivery?: No  Feeding Mother's Current Feeding Choice: Breast Milk  LATCH Score Latch: Repeated attempts needed to sustain latch, nipple held in mouth throughout feeding, stimulation needed to elicit sucking reflex.  Audible Swallowing: None  Type of Nipple: Inverted  Comfort (Breast/Nipple): Soft / non-tender  Hold (Positioning): Full assist, staff holds infant at breast  LATCH Score: 3   Lactation Tools Discussed/Used    Interventions Interventions: Breast massage;Hand express;Support pillows;Position options  Discharge    Consult Status Consult Status: Follow-up Date: 04/28/21 Follow-up type: In-patient    Stevan Born [facility] 04/28/2021, 9:21 AM

## 2021-04-28 NOTE — Discharge Summary (Addendum)
Postpartum Discharge Summary  Date of Service updated 04/30/2021    Patient Name: Miranda Caldwell DOB: [birth year] MRN: [id number]  Date of admission: 04/27/2021 Delivery date:04/28/2021  Delivering provider: Randa Ngo  Date of discharge: 05/01/2021  Admitting diagnosis: Supervision of low-risk pregnancy, third trimester [Z34.93] Intrauterine pregnancy: [redacted]w[redacted]d    Secondary diagnosis:  Principal Problem:   Vaginal delivery Active Problems:   Language barrier   Supervision of low-risk pregnancy, third trimester  Additional problems: as noted above Discharge diagnosis: Term Delivery delivered                                       Post partum procedures:none Augmentation: AROM Complications: None  Hospital course: Onset of Labor With Vaginal Delivery      35y.o. yo G1P0 at 464w3das admitted in Latent Labor on 04/27/2021. Patient had an uncomplicated labor course as follows:  Membrane Rupture Time/Date: 4:04 AM ,04/28/2021   Delivery Method:Vaginal, Spontaneous  Episiotomy: None  Lacerations:  2nd degree;Perineal  Patient had an uncomplicated postpartum course.  She is ambulating, tolerating a regular diet, passing flatus, and urinating well. Patient is discharged home in stable condition on 05/01/21.  Newborn Data: Birth date:04/28/2021  Birth time:8:09 AM  Gender:Female  Living status:Living  Apgars:9 ,9  Weight:3135 g   Magnesium Sulfate received: No BMZ received: No Rhophylac:N/A MMR:N/A T-DaP: offered postpartum Transfusion:No  Physical exam  Vitals:   04/29/21 0506 04/29/21 1303 04/29/21 2245 04/30/21 0613  BP: 102/68 96/68 100/65 106/68  Pulse: 62 80 77 (!) 59  Resp: 16 17 18 18   Temp: 97.7 F (36.5 C) (!) 97.5 F (36.4 C) 97.6 F (36.4 C) 98 F (36.7 C)  TempSrc: Oral Oral Oral Oral  SpO2: 99% 99%  98%   General: alert, cooperative and no distress Lochia: appropriate Uterine Fundus: firm Incision: N/A DVT Evaluation: No evidence of DVT seen on  physical exam.  Labs: Lab Results  Component Value Date   WBC 11.4 (H) 04/27/2021   HGB 12.9 04/27/2021   HCT 38.1 04/27/2021   MCV 88.6 04/27/2021   PLT 237 04/27/2021   CMP Latest Ref Rng & Units 03/01/2021  Glucose 70 - 99 mg/dL 85  BUN 6 - 20 mg/dL 5(L)  Creatinine 0.44 - 1.00 mg/dL 0.46  Sodium 135 - 145 mmol/L 131(L)  Potassium 3.5 - 5.1 mmol/L 3.6  Chloride 98 - 111 mmol/L 102  CO2 22 - 32 mmol/L 20(L)  Calcium 8.9 - 10.3 mg/dL 8.8(L)  Total Protein 6.5 - 8.1 g/dL 6.6  Total Bilirubin 0.3 - 1.2 mg/dL 0.1(L)  Alkaline Phos 38 - 126 U/L 58  AST 15 - 41 U/L 19  ALT 0 - 44 U/L 15   Edinburgh Score: Edinburgh Postnatal Depression Scale Screening Tool 04/29/2021  I have been able to laugh and see the funny side of things. 0  I have looked forward with enjoyment to things. 0  I have blamed myself unnecessarily when things went wrong. 2  I have been anxious or worried for no good reason. 2  I have felt scared or panicky for no good reason. 0  Things have been getting on top of me. 1  I have been so unhappy that I have had difficulty sleeping. 1  I have felt sad or miserable. 0  I have been so unhappy that I have been crying. 0  The thought  of harming myself has occurred to me. 0  Edinburgh Postnatal Depression Scale Total 6     After visit meds:  Allergies as of 04/30/2021   No Known Allergies     Medication List    STOP taking these medications   ferrous sulfate 325 (65 FE) MG tablet Commonly known as: FerrouSul     TAKE these medications   acetaminophen 325 MG tablet Commonly known as: Tylenol Take 2 tablets (650 mg total) by mouth every 6 (six) hours.   ibuprofen 600 MG tablet Commonly known as: ADVIL Take 1 tablet (600 mg total) by mouth every 6 (six) hours.   prenatal multivitamin Tabs tablet Take 1 tablet by mouth daily at 12 noon.       Discharge home in stable condition Infant Feeding: Breast Infant Disposition:home with mother Discharge  instruction: per After Visit Summary and Postpartum booklet. Activity: Advance as tolerated. Pelvic rest for 6 weeks.  Diet: routine diet Future Appointments: Future Appointments  Date Time Provider [place]  06/11/2021 10:45 AM Truett Mainland, DO [facility] None   Follow up Visit: Message sent to HP by Dr. Astrid Drafts.  Please schedule this patient for a In person postpartum visit in 6 weeks with the following provider: Any provider. Additional Postpartum F/U:none  Low risk pregnancy complicated by: Language barrier (primary language [place]) Delivery mode:  Vaginal, Spontaneous  Anticipated Birth Control:  Unsure, thinking about IUD   05/01/2021 Will Weisgerber MD  I saw and evaluated the patient. I agree with the findings and the plan of care as documented in the resident's note.  Sharene Skeans, MD [facility] Family Medicine Fellow, [facility] for [facility], [place]

## 2021-04-29 ENCOUNTER — Encounter: Payer: Medicaid Other | Admitting: Family Medicine

## 2021-04-29 NOTE — Lactation Note (Signed)
This note was copied from a baby's chart. Lactation Consultation Note  Patient Name: Miranda Caldwell [id number]'U Date: 04/29/2021 Reason for consult: Follow-up assessment;Mother's request;Difficult latch;Nipple pain/trauma;Term Age:34 hours   Family called out for assistance.  When LC arrived, dad had fed infant 15ml of formula.  Dad was concerned bc infant had been sleeping for 3 hours.    Infant still alert and cueing.  LC offered to assist with latch.  Mom used 24 NS and placed over nipple.  [facility] taught parents how to use the curved tip syringe in order to prefill shield.    Infant latched and began sucking.  Mom had a pain of 6 when infant first latched but pain decreased quickly to a 4.  After a minute mom felt more comfortable.  She did not want to cease the breastfeed.  Mom looked more relaxed and infant continuously sucked with no part of the nipple shield visible.  [facility] taught dad how to use a rolled blanket for extra support for infant's head.  Infant fed well with flanged lips and wide gape. Mom used massage to encouraged continual sucking.  When infant stopped sucking, mom broke the latch and nipple was rounded and pulled well into the NS.  Mom successfully latched infant herself using the football hold on the other breast.  Infant had good jaw movements with some pauses.  A few swallows heard.  Dad was able to demonstrate prefilling the NS.  Mom was provided comfort gels for nipples.    Plan:  Mom will offer the breast with cues, hand expressing prior to latching.  NS will be used and prefilled with EBM or formula.  Mom will pump with a goal of 4-6 times daily or after a breastfeed/ when dad is giving infant a bottle.    Questions answered and family will call out for assistance or questions if needed.   Maternal Data    Feeding Mother's Current Feeding Choice: Breast Milk and Formula Nipple Type: Extra Slow Flow  LATCH Score Latch: Grasps breast easily, tongue down, lips flanged,  rhythmical sucking.  Audible Swallowing: A few with stimulation  Type of Nipple: Inverted  Comfort (Breast/Nipple): Filling, red/small blisters or bruises, mild/mod discomfort  Hold (Positioning): Assistance needed to correctly position infant at breast and maintain latch.  LATCH Score: 5   Lactation Tools Discussed/Used Tools: Nipple Shields Nipple shield size: 24 Breast pump type: Double-Electric Breast Pump Pump Education: Setup, frequency, and cleaning Pumping frequency: encouraged every 3 hours or after BF/ when dad feeds infant a bottle Pumped volume: 0 mL  Interventions Interventions: Breast feeding basics reviewed;Assisted with latch;Skin to skin;Position options  Discharge Pump: DEBP  Consult Status Consult Status: Follow-up Date: 04/30/21 Follow-up type: In-patient    Maryruth Hancock [facility] 04/29/2021, 10:33 PM

## 2021-04-29 NOTE — Lactation Note (Signed)
This note was copied from a baby's chart. Lactation Consultation Note  Patient Name: Miranda Caldwell VELFY'B Date: 04/29/2021 Reason for consult: Follow-up assessment;Difficult latch;Nipple pain/trauma;Term Age:34 hours   Infant asleep in bassinet.  Mom wants to let infant sleep since infant fed 1 hr. Ago per mom.  LC assessed mom's breasts.  Right breast a arc-shaped one-inch abrasion over nipple.  Mom states with dad interpreting that the cut is from the DEBP.  LC had mom demonstrate what happened and mom feels the flange was placed incorrectly and nipple was not centered.  Mom is very sore.  She hasn't pumped since yesterday because she did not collect any.  Pumping purpose explained.  Mom feels the NS is too small and is using a 20.  She states it's painful when using it.  LC offered to assist with latch using a 24 NS.  Mom didn't want to wake her baby but said she will call out for assistance when infant wakes.    Hand expression reviewed with mom and she was encouraged to hand express prior to latching and prior to and after pumping.    Mom moved to chair and began pumping.  Vacuum at lowest level.  Mom denies pain.    Plan: pump regularly every 2-3 hours to stimulate breast/milk supply while using NS and working on latching.   Hand express.   Call out for assistance with next feeding.  Maternal Data Has patient been taught Hand Expression?: Yes  Feeding Mother's Current Feeding Choice: Breast Milk and Formula  LATCH Score                    Lactation Tools Discussed/Used Tools: Pump Breast pump type: Double-Electric Breast Pump Reason for Pumping: stimulate milk supply/ NS use/difficult latch Pumping frequency: Mom pumped yesterday. Pumping purpose discussed with family and importance of consistency reviewed with family  Interventions Interventions: DEBP  Discharge    Consult Status Consult Status: Follow-up Date: 04/29/21 Follow-up type:  In-patient    Maryruth Hancock [facility] 04/29/2021, 6:10 PM

## 2021-04-29 NOTE — Progress Notes (Addendum)
POSTPARTUM PROGRESS NOTE  Post Partum Day 1  Subjective:  Miranda Caldwell is a 35 y.o. G1P1001 s/p SVD at [redacted]w[redacted]d. She reports that she is doing well. No acute events overnight. She denies any problems with ambulating, voiding or po intake. Denies nausea or vomiting. No BM yet but has been passing gas. Pain is well controlled. Lochia is minimal.  Objective: Blood pressure 108/74, pulse 77, temperature 97.7 F (36.5 C), temperature source Oral, resp. rate 18, last menstrual period 07/19/2020, SpO2 100 %, unknown if currently breastfeeding.  Physical Exam:  General: alert, cooperative and no distress Chest: no respiratory distress Heart:regular rate, distal pulses intact Abdomen: soft, nontender Uterine Fundus: firm, appropriately tender DVT Evaluation: No calf swelling or tenderness Extremities: no edema Skin: warm, dry  Recent Labs    04/27/21 2157  HGB 12.9  HCT 38.1    Assessment/Plan: Miranda Caldwell is a 35 y.o. G1P1001 s/p SVD at [redacted]w[redacted]d.   PPD#1 - Doing well. Continue routine postpartum care.  Contraception: Discussed IUD vs. POPs. Pt would like to read about these more on her own before she decides. Feeding: Breast Dispo: Plan for discharge likely 04/30/2021   LOS: 2 days   Franchot Erichsen MD, PGY-1 OBGYN Faculty Teaching Service  04/29/2021, 12:08 AM  Attestation of Supervision of Resident:  I confirm that I have verified the information documented in the  resident's  note and that I have also personally reperformed the history, physical exam and all medical decision making activities.  I have verified that all services and findings are accurately documented in this student's note; and I agree with management and plan as outlined in the documentation. I have also made any necessary editorial changes.  Sheila Oats, MD [facility], [facility] 04/29/2021 7:20 AM

## 2021-04-29 NOTE — Anesthesia Postprocedure Evaluation (Signed)
Anesthesia Post Note  Patient: Miranda Caldwell  Procedure(s) Performed: AN AD HOC LABOR EPIDURAL     Patient location during evaluation: Mother Baby Anesthesia Type: Epidural Level of consciousness: awake, awake and alert and oriented Pain management: pain level controlled Vital Signs Assessment: post-procedure vital signs reviewed and stable Respiratory status: spontaneous breathing, respiratory function stable and nonlabored ventilation Cardiovascular status: stable Postop Assessment: no headache, adequate PO intake, able to ambulate, patient able to bend at knees, no backache and no apparent nausea or vomiting Anesthetic complications: no   No complications documented.  Last Vitals:  Vitals:   04/29/21 0045 04/29/21 0506  BP: 91/64 102/68  Pulse: 75 62  Resp: 17 16  Temp: 36.8 C 36.5 C  SpO2: 100% 99%    Last Pain:  Vitals:   04/29/21 0753  TempSrc:   PainSc: 3    Pain Goal:                   [name]

## 2021-04-30 MED ORDER — IBUPROFEN 600 MG PO TABS: 600.0000 mg | ORAL_TABLET | Freq: Four times a day (QID) | ORAL | 0 refills | Status: DC

## 2021-04-30 MED ORDER — ACETAMINOPHEN 325 MG PO TABS: 650.0000 mg | ORAL_TABLET | Freq: Four times a day (QID) | ORAL | Status: DC

## 2021-04-30 NOTE — Lactation Note (Signed)
This note was copied from a baby's chart. Lactation Consultation Note  Patient Name: Miranda Caldwell GHWEX'H Date: 04/30/2021 Reason for consult: Follow-up assessment;Term;Primapara;1st time breastfeeding Age:34 hours   P1 mother whose infant is now 40 hours old.  This is a term baby at 40+3 weeks.  Mother's feeding preference is to exclusively breast feed, however, she is providing supplementation until her milk comes to full volume and baby is breast feeding well.  Mother had baby latched using a #24 NS when I arrived; baby had been feeding for approximately 20 minutes.  Demonstrated a deeper latch and assisted to arouse baby.  Removed baby from the NS, burped and latched again.  Baby fed for an additional 5 minutes before releasing.  Father fed formula supplementation (encouraged 30 mls or more) while I assisted mother with pumping.  Showed her how to use her own coconut oil for nipple comfort and flange lubrication.    Reviewed paced bottle feeding and burping with father.  Encouraged more head control and firmer patting on the back.  Baby burped well.  Pediatrician in room during my visit to assess baby.  She will have baby reweighed at 1400 and, if weight loss is appropriate, parents will be discharged home.    Mother does not have a DEBP for home use.  Discussed the importance of having a pump especially since mother is using a NS.  Asked father to call the [facility] office now to determine pump eligibility.  Parents may be interested in obtaining a [facility] loaner if they cannot obtain one today.  Will check after the baby's follow up weight check this afternoon.   Maternal Data Has patient been taught Hand Expression?: Yes Does the patient have breastfeeding experience prior to this delivery?: No  Feeding Mother's Current Feeding Choice: Breast Milk and Formula  LATCH Score Latch: Grasps breast easily, tongue down, lips flanged, rhythmical sucking.  Audible Swallowing: A few with  stimulation  Type of Nipple: Everted at rest and after stimulation (Short shafted; sensitive)  Comfort (Breast/Nipple): Filling, red/small blisters or bruises, mild/mod discomfort  Hold (Positioning): Assistance needed to correctly position infant at breast and maintain latch.  LATCH Score: 7   Lactation Tools Discussed/Used Tools: Pump;Flanges;Coconut oil;Nipple Shields (Personal coconut oil) Nipple shield size: 24 Flange Size: 24;27 Breast pump type: Double-Electric Breast Pump;Manual Pump Education: Setup, frequency, and cleaning;Milk Storage (Reviewed) Reason for Pumping: Stimulation for supplementation; mother using a NS Pumping frequency: Every three hours  Interventions Interventions: Breast feeding basics reviewed;Assisted with latch;Skin to skin;Breast massage;Hand express;Breast compression;Adjust position;DEBP;Hand pump;Coconut oil;Expressed milk;Position options;Support pillows;Education  Discharge Pump: DEBP;Manual;Personal (Mother desires a [facility] pump) WIC Program: Yes  Consult Status Consult Status: Follow-up Date: 05/01/21 Follow-up type: In-patient    Ananias Kolander R Vedder Brittian 04/30/2021, 11:11 AM

## 2021-05-07 ENCOUNTER — Encounter: Payer: Self-pay | Admitting: General Practice

## 2021-05-20 DIAGNOSIS — Z3A36 36 weeks gestation of pregnancy: Secondary | ICD-10-CM | POA: Insufficient documentation

## 2021-05-21 ENCOUNTER — Encounter (HOSPITAL_BASED_OUTPATIENT_CLINIC_OR_DEPARTMENT_OTHER): Payer: Self-pay | Admitting: *Deleted

## 2021-05-21 ENCOUNTER — Other Ambulatory Visit: Payer: Self-pay

## 2021-05-21 ENCOUNTER — Emergency Department (HOSPITAL_BASED_OUTPATIENT_CLINIC_OR_DEPARTMENT_OTHER)
Admission: EM | Admit: 2021-05-21 | Discharge: 2021-05-21 | Disposition: A | Discharge status: Home / Self Care | Payer: Medicaid Other | Attending: Emergency Medicine | Admitting: Emergency Medicine

## 2021-05-21 DIAGNOSIS — R519 Headache, unspecified: Secondary | ICD-10-CM | POA: Diagnosis not present

## 2021-05-21 DIAGNOSIS — Z20822 Contact with and (suspected) exposure to covid-19: Secondary | ICD-10-CM | POA: Diagnosis not present

## 2021-05-21 DIAGNOSIS — M545 Low back pain, unspecified: Secondary | ICD-10-CM | POA: Diagnosis not present

## 2021-05-21 DIAGNOSIS — R6883 Chills (without fever): Secondary | ICD-10-CM

## 2021-05-21 DIAGNOSIS — R509 Fever, unspecified: Secondary | ICD-10-CM | POA: Diagnosis not present

## 2021-05-21 LAB — URINALYSIS, MICROSCOPIC (REFLEX)

## 2021-05-21 LAB — CBC WITH DIFFERENTIAL/PLATELET
Abs Immature Granulocytes: 0.01 10*3/uL (ref 0.00–0.07)
Basophils Absolute: 0 10*3/uL (ref 0.0–0.1)
Basophils Relative: 1 %
Eosinophils Absolute: 0.1 10*3/uL (ref 0.0–0.5)
Eosinophils Relative: 3 %
HCT: 39.9 % (ref 36.0–46.0)
Hemoglobin: 13.6 g/dL (ref 12.0–15.0)
Immature Granulocytes: 0 %
Lymphocytes Relative: 51 %
Lymphs Abs: 2.4 10*3/uL (ref 0.7–4.0)
MCH: 29.7 pg (ref 26.0–34.0)
MCHC: 34.1 g/dL (ref 30.0–36.0)
MCV: 87.1 fL (ref 80.0–100.0)
Monocytes Absolute: 0.4 10*3/uL (ref 0.1–1.0)
Monocytes Relative: 9 %
Neutro Abs: 1.7 10*3/uL (ref 1.7–7.7)
Neutrophils Relative %: 36 %
Platelets: 357 10*3/uL (ref 150–400)
RBC: 4.58 MIL/uL (ref 3.87–5.11)
RDW: 14.5 % (ref 11.5–15.5)
WBC: 4.6 10*3/uL (ref 4.0–10.5)
nRBC: 0 % (ref 0.0–0.2)

## 2021-05-21 LAB — URINALYSIS, ROUTINE W REFLEX MICROSCOPIC
Bilirubin Urine: NEGATIVE
Glucose, UA: NEGATIVE mg/dL
Ketones, ur: NEGATIVE mg/dL
Nitrite: NEGATIVE
Protein, ur: NEGATIVE mg/dL
Specific Gravity, Urine: 1.01 (ref 1.005–1.030)
pH: 5.5 (ref 5.0–8.0)

## 2021-05-21 LAB — COMPREHENSIVE METABOLIC PANEL
ALT: 21 U/L (ref 0–44)
AST: 21 U/L (ref 15–41)
Albumin: 4 g/dL (ref 3.5–5.0)
Alkaline Phosphatase: 95 U/L (ref 38–126)
Anion gap: 8 (ref 5–15)
BUN: 13 mg/dL (ref 6–20)
CO2: 25 mmol/L (ref 22–32)
Calcium: 9.2 mg/dL (ref 8.9–10.3)
Chloride: 102 mmol/L (ref 98–111)
Creatinine, Ser: 0.61 mg/dL (ref 0.44–1.00)
GFR, Estimated: >60 mL/min (ref >60)
Glucose, Bld: 100 mg/dL — ABNORMAL HIGH (ref 70–99)
Potassium: 3.8 mmol/L (ref 3.5–5.1)
Sodium: 135 mmol/L (ref 135–145)
Total Bilirubin: 0.3 mg/dL (ref 0.3–1.2)
Total Protein: 7.6 g/dL (ref 6.5–8.1)

## 2021-05-21 LAB — RESP PANEL BY RT-PCR (FLU A&B, COVID) ARPGX2
Influenza A by PCR: NEGATIVE
Influenza B by PCR: NEGATIVE
SARS Coronavirus 2 by RT PCR: NEGATIVE

## 2021-05-21 NOTE — Discharge Instructions (Addendum)
Recommend follow-up with your primary doctor.  Return to ER if you develop fever, worsening headache, difficulty breathing, abdominal pain, vomiting or other new concerning symptom.

## 2021-05-21 NOTE — ED Triage Notes (Signed)
Fever since yesterday. Headache. Back pain. States she gave birth 28 days ago. She is breast feeding.

## 2021-05-21 NOTE — ED Provider Notes (Signed)
[facility] EMERGENCY DEPARTMENT Provider Note   CSN: [id number] Arrival date & time: 05/21/21  1501     History Chief Complaint  Patient presents with   Fever    Rodnesha Elie is a 35 y.o. female.  Presented to ER with concern for fever.  Patient reports that yesterday she had an episode of chills and felt that she may have had a fever.  No chills or fever today.  Never checked her temperature.  On review of systems, does endorse that she has had periodic headaches since delivery.  States that she does get occasional headaches.  Currently pain is 1 out of 10 in severity.  Not sudden onset, not worst headache of her life.  No alleviating or aggravating factors.  Also has been having some mild to moderate low back pain.  States that this has been going on throughout pregnancy and since delivery.  No acute changes today.  Does not have any breast pain, no redness, no swelling of her breasts.  No issues with breast-feeding.  No vaginal discharge or bleeding, no abdominal pain or pelvic pain.  Currently does not have any symptoms besides the mild headache. No neck pain or neck stiffness.  Patient primarily French-speaking, utilized Electronics engineer throughout visit.  HPI     History reviewed. No pertinent past medical history.  Patient Active Problem List   Diagnosis Date Noted   [redacted] weeks gestation of pregnancy 05/20/2021   Vaginal delivery 04/28/2021   Supervision of low-risk pregnancy, third trimester 04/27/2021   Positive GBS test 04/06/2021   Supervision of normal first pregnancy, antepartum 09/02/2020   Language barrier 09/02/2020    History reviewed. No pertinent surgical history.   OB History     Gravida  1   Para  1   Term  1   Preterm      AB      Living  1      SAB      IAB      Ectopic      Multiple  0   Live Births  1           Family History  Problem Relation Age of Onset   Stroke Neg Hx    Obesity Neg Hx     Hypertension Neg Hx     Social History   Tobacco Use   Smoking status: Never   Smokeless tobacco: Never  Vaping Use   Vaping Use: Never used  Substance Use Topics   Alcohol use: Never   Drug use: Never    Home Medications Prior to Admission medications   Medication Sig Start Date End Date Taking? Authorizing Provider  acetaminophen (TYLENOL) 325 MG tablet Take 2 tablets (650 mg total) by mouth every 6 (six) hours. 04/30/21   Rosalio Loud, MD  ibuprofen (ADVIL) 600 MG tablet Take 1 tablet (600 mg total) by mouth every 6 (six) hours. 04/30/21   Rosalio Loud, MD  Prenatal Vit-Fe Fumarate-FA (PRENATAL MULTIVITAMIN) TABS tablet Take 1 tablet by mouth daily at 12 noon.    [provider]    Allergies    Patient has no known allergies.  Review of Systems   Review of Systems  Constitutional:  Positive for chills, fatigue and fever.  HENT:  Negative for ear pain and sore throat.   Eyes:  Negative for pain and visual disturbance.  Respiratory:  Negative for cough and shortness of breath.   Cardiovascular:  Negative for  chest pain and palpitations.  Gastrointestinal:  Negative for abdominal pain and vomiting.  Genitourinary:  Negative for dysuria and hematuria.  Musculoskeletal:  Positive for back pain. Negative for arthralgias.  Skin:  Negative for color change and rash.  Neurological:  Positive for headaches. Negative for seizures and syncope.  All other systems reviewed and are negative.  Physical Exam Updated Vital Signs BP (!) 120/96 (BP Location: Left Arm)   Pulse 88   Temp 98.6 F (37 C) (Oral)   Resp 18   Ht 5\' 4"  (1.626 m)   Wt 69.9 kg   SpO2 99%   BMI 26.47 kg/m   Physical Exam Vitals and nursing note reviewed.  Constitutional:      General: She is not in acute distress.    Appearance: She is well-developed.  HENT:     Head: Normocephalic and atraumatic.  Eyes:     Conjunctiva/sclera: Conjunctivae normal.  Cardiovascular:     Rate  and Rhythm: Normal rate and regular rhythm.     Heart sounds: No murmur heard. Pulmonary:     Effort: Pulmonary effort is normal. No respiratory distress.     Breath sounds: Normal breath sounds.  Abdominal:     Palpations: Abdomen is soft.     Tenderness: There is no abdominal tenderness.  Musculoskeletal:     Cervical back: Neck supple.  Skin:    General: Skin is warm and dry.  Neurological:     General: No focal deficit present.     Mental Status: She is alert and oriented to person, place, and time.  Psychiatric:        Mood and Affect: Mood normal.    ED Results / Procedures / Treatments   Labs (all labs ordered are listed, but only abnormal results are displayed) Labs Reviewed  URINALYSIS, ROUTINE W REFLEX MICROSCOPIC - Abnormal; Notable for the following components:      Result Value   APPearance HAZY (*)    Hgb urine dipstick MODERATE (*)    Leukocytes,Ua MODERATE (*)    All other components within normal limits  URINALYSIS, MICROSCOPIC (REFLEX) - Abnormal; Notable for the following components:   Bacteria, UA RARE (*)    All other components within normal limits  COMPREHENSIVE METABOLIC PANEL - Abnormal; Notable for the following components:   Glucose, Bld 100 (*)    All other components within normal limits  RESP PANEL BY RT-PCR (FLU A&B, COVID) [id number]  CBC WITH DIFFERENTIAL/PLATELET    EKG None  Radiology No results found.  Procedures Procedures   Medications Ordered in ED Medications - No data to display  ED Course  I have reviewed the triage vital signs and the nursing notes.  Pertinent labs & imaging results that were available during my care of the patient were reviewed by me and considered in my medical decision making (see chart for details).    MDM Rules/Calculators/A&P                          35 year old lady presents to ER with concern for possible fever.  Notably about 1 month postpartum from vaginal delivery.  No complications with  delivery or pregnancy per patient.  On exam, patient is well-appearing in no distress.  She did endorse headache and low back pain but the symptoms have been going on for many weeks.  No other acute symptoms today.  Basic labs stable.  UA negative for infection.  COVID-negative.  Lungs  clear, no cough, doubt pneumonia.  Given reassuring work-up, believe patient can be discharged and managed in the outpatient setting.  Recommend she follow-up with her primary doctor.  Given reassuring exam and reassuring work-up, do not see indication for antibiotics at present.  Reviewed return precautions and discharged home.  After the discussed management above, the patient was determined to be safe for discharge.  The patient was in agreement with this plan and all questions regarding their care were answered.  ED return precautions were discussed and the patient will return to the ED with any significant worsening of condition.  Final Clinical Impression(s) / ED Diagnoses Final diagnoses:  None    Rx / DC Orders ED Discharge Orders     None        Milagros Loll, MD 05/21/21 1956

## 2021-06-11 ENCOUNTER — Encounter: Payer: Self-pay | Admitting: Family Medicine

## 2021-06-11 ENCOUNTER — Other Ambulatory Visit: Payer: Self-pay

## 2021-06-11 ENCOUNTER — Ambulatory Visit (INDEPENDENT_AMBULATORY_CARE_PROVIDER_SITE_OTHER): Payer: Medicaid Other | Admitting: Family Medicine

## 2021-06-11 MED ORDER — NORETHINDRONE 0.35 MG PO TABS: 1.0000 | ORAL_TABLET | Freq: Every day | ORAL | 3 refills | Status: DC

## 2021-06-11 MED ORDER — HYDROCHLOROTHIAZIDE 12.5 MG PO CAPS: 12.5000 mg | ORAL_CAPSULE | Freq: Every day | ORAL | 0 refills | Status: DC

## 2021-06-11 NOTE — Progress Notes (Signed)
    Post Partum Visit Note  Miranda Caldwell is a 35 y.o. G55P1001 female who presents for a postpartum visit. She is 6 weeks postpartum following a normal spontaneous vaginal delivery.  I have fully reviewed the prenatal and intrapartum course. The delivery was at 40.[redacted] week gestational weeks.  Anesthesia: local. Postpartum course has been uncomplicated. Baby is doing well. Baby is feeding by breast. Bleeding no bleeding. Bowel function is normal. Bladder function is normal. Patient is not sexually active. Contraception method is oral progesterone-only contraceptive. Postpartum depression screening: negative.  She is having some pain on her perineum and thinks that there is an area that is open.  The pregnancy intention screening data noted above was reviewed. Potential methods of contraception were discussed. The patient elected to proceed with No data recorded.    Health Maintenance Due  Topic Date Due   TETANUS/TDAP  Never done    The following portions of the patient's history were reviewed and updated as appropriate: allergies, current medications, past family history, past medical history, past social history, past surgical history, and problem list.  Review of Systems Pertinent items are noted in HPI.  Objective:  BP 116/72   Pulse 78   Wt 146 lb (66.2 kg)   BMI 25.06 kg/m    General:  alert, cooperative, and no distress  Lungs: clear to auscultation bilaterally  Heart:  regular rate and rhythm, S1, S2 normal, no murmur, click, rub or gallop  Abdomen: soft, non-tender; bowel sounds normal; no masses,  no organomegaly   GU exam:   57mm area on perineum that is not healed with subcutaneous tissue exposed. Silver nitrated used on that area       Assessment:   1. Postpartum exam      Plan:   Essential components of care per ACOG recommendations:  1.  Mood and well being: Patient with negative depression screening today. Reviewed local resources for support.  - Patient  tobacco use? No.   - hx of drug use? No.    2. Infant care and feeding:  -Patient currently breastmilk feeding? Yes. Reviewed importance of draining breast regularly to support lactation.  -Social determinants of health (SDOH) reviewed in EPIC. No concerns  3. Sexuality, contraception and birth spacing - Patient does not want a pregnancy in the next year.   - Reviewed forms of contraception in tiered fashion. Patient desired oral progesterone-only contraceptive today.   - Discussed birth spacing of 18 months  4. Sleep and fatigue -Encouraged family/partner/community support of 4 hrs of uninterrupted sleep to help with mood and fatigue  5. Physical Recovery  - Discussed patients delivery and complications. She describes her labor as good. - Patient had a Vaginal, no problems at delivery. Patient had a 2nd degree laceration. Perineal healing reviewed. Patient expressed understanding - Patient has urinary incontinence? No. - Patient is not safe to resume physical and sexual activity  6.  Health Maintenance - HM due items addressed N/a - Last pap smear 09/02/2020 Diagnosis  Date Value Ref Range Status  09/02/2020   Final   - Negative for intraepithelial lesion or malignancy (NILM)   Pap smear not done at today's visit.  -Breast Cancer screening indicated? No.   7. Chronic Disease/Pregnancy Condition follow up:  follow up in 1 week for perineum check.  - PCP follow up  Levie Heritage, DO [facility] for [employer], [facility]

## 2021-06-19 ENCOUNTER — Encounter: Payer: Self-pay | Admitting: Family Medicine

## 2021-06-19 ENCOUNTER — Ambulatory Visit (INDEPENDENT_AMBULATORY_CARE_PROVIDER_SITE_OTHER): Payer: Medicaid Other | Admitting: Family Medicine

## 2021-06-19 ENCOUNTER — Other Ambulatory Visit: Payer: Self-pay

## 2021-06-19 VITALS — BP 114/79 | HR 79 | Wt 158.0 lb

## 2021-06-19 DIAGNOSIS — M79672 Pain in left foot: Secondary | ICD-10-CM | POA: Diagnosis not present

## 2021-06-19 DIAGNOSIS — L928 Other granulomatous disorders of the skin and subcutaneous tissue: Secondary | ICD-10-CM

## 2021-06-19 DIAGNOSIS — S3994XS Unspecified injury of external genitals, sequela: Secondary | ICD-10-CM

## 2021-06-19 DIAGNOSIS — M25559 Pain in unspecified hip: Secondary | ICD-10-CM | POA: Diagnosis not present

## 2021-06-19 NOTE — Progress Notes (Signed)
   Subjective:    Patient ID: Miranda Caldwell, female    DOB: 1986/10/06, 35 y.o.   MRN: [id number]  HPI  Patient reports less pain at the perineum since last week.  Still some pain with pressure, but feels improved.  Additionally, the patient is having some lower abdominal pain and hip pain, especially with bending and exercising.  Pain is dull and nonradiating.  Also having dull pain pain on left foot and ankle with running.  Review of Systems     Objective:   Physical Exam Vitals reviewed. Exam conducted with a chaperone present.  Constitutional:      Appearance: Normal appearance.  Genitourinary:    Comments: The small 8 mm peritoneal defect is still present.  It does appear like it has good granulation tissue and is starting to heal by secondary intention. Skin:    Capillary Refill: Capillary refill takes less than 2 seconds.  Neurological:     General: No focal deficit present.     Mental Status: She is alert.  Psychiatric:        Mood and Affect: Mood normal.        Behavior: Behavior normal.        Thought Content: Thought content normal.      Assessment & Plan:  1. Injury of female perineum, sequela Silver nitrate reapplied. F/u in 2-3 weeks  2. Hip pain Will refer to PT - Ambulatory referral to Physical Therapy  3. Left foot pain Refer to PT - Ambulatory referral to Physical Therapy

## 2021-06-19 NOTE — Progress Notes (Signed)
[employer] language interpreter Elmyra Ricks [phone].

## 2021-07-06 ENCOUNTER — Ambulatory Visit: Payer: Medicaid Other | Attending: Family Medicine | Admitting: Physical Therapy

## 2021-07-06 ENCOUNTER — Other Ambulatory Visit: Payer: Self-pay

## 2021-07-06 DIAGNOSIS — M25552 Pain in left hip: Secondary | ICD-10-CM | POA: Diagnosis present

## 2021-07-06 DIAGNOSIS — R252 Cramp and spasm: Secondary | ICD-10-CM

## 2021-07-06 DIAGNOSIS — M25652 Stiffness of left hip, not elsewhere classified: Secondary | ICD-10-CM | POA: Diagnosis present

## 2021-07-06 DIAGNOSIS — M25572 Pain in left ankle and joints of left foot: Secondary | ICD-10-CM

## 2021-07-06 NOTE — Patient Instructions (Signed)
Access Code: [email or web address] URL: [email or web address] Date: 07/06/2021 Prepared by: Anabel Halon  Exercises Hooklying Single Knee to Chest Stretch - 2 x daily - 7 x weekly - 1 sets - 2 reps - 20s hold Child's Pose Stretch - 2 x daily - 7 x weekly - 1 sets - 5 reps - 10s hold Clamshell with Resistance - 1 x daily - 7 x weekly - 3 sets - 10 reps Supine Bridge with Mini Swiss Ball Between Knees - 1 x daily - 7 x weekly - 3 sets - 10 reps Gastroc Stretch with Foot at Wall - 2 x daily - 7 x weekly - 1 sets - 2 reps - 20s hold

## 2021-07-06 NOTE — Therapy (Signed)
[facility] [address] [place], [place], [address] Phone: [phone]   Fax:  [phone]  Physical Therapy Evaluation  Patient Details  Name: Miranda Caldwell MRN: [id number] Date of Birth: June 06, 1986 Referring Provider (PT): Candelaria Celeste, DO   Encounter Date: 07/06/2021   PT End of Session - 07/06/21 1625     Visit Number 1    Date for PT Re-Evaluation 08/31/21    Authorization Type [employer] MCD    Authorization Time Period requested 8 visits 07/06/2021 - 08/31/2021    PT Start Time 1446    PT Stop Time 1534    PT Time Calculation (min) 48 min    Activity Tolerance Patient tolerated treatment well    Behavior During Therapy [facility] for tasks assessed/performed             No past medical history on file.  No past surgical history on file.  There were no vitals filed for this visit.    Subjective Assessment - 07/06/21 1450     Subjective Patient presenting due to Lt hip and foot pain as well as abdominal tightness. Patient recently giving birth 04/28/2021. States that pain began at that time. Reports that pain was 9/10 but is mostly stiffness now however, stiffness increases with prolonged activity. Would also like to be seen by pelvic floor as she believed that this would be included with this visit.    Patient is accompained by: --   Interpreter through Ipad   Limitations Standing    Currently in Pain? No/denies    Pain Score --    Pain Location --    Pain Orientation --    Multiple Pain Sites --    Pain Score --    Pain Location --    Pain Orientation --    Pain Onset --    Pain Frequency --                [facility] PT Assessment - 07/06/21 0001       Assessment   Medical Diagnosis M25.559 (ICD-10-CM) - Hip pain  M79.672 (ICD-10-CM) - Left foot pain    Referring Provider (PT) Candelaria Celeste, DO    Prior Therapy No      Precautions   Precautions Other (comment)    Precaution Comments French  speaking      Restrictions   Weight Bearing Restrictions No      Balance Screen   Has the patient fallen in the past 6 months No    Has the patient had a decrease in activity level because of a fear of falling?  No    Is the patient reluctant to leave their home because of a fear of falling?  No      Prior Function   Level of Independence Independent    Vocation Unemployed      Cognition   Overall Cognitive Status Within Functional Limits for tasks assessed      Observation/Other Assessments   Focus on Therapeutic Outcomes (FOTO)  to be assessed      Functional Tests   Functional tests Single leg stance      Single Leg Stance   Comments x15 seconds Lt/Rt      Posture/Postural Control   Posture/Postural Control No significant limitations      ROM / Strength   AROM / PROM / Strength Strength      Strength   Strength Assessment Site Hip;Knee;Ankle    Right/Left Hip Right;Left  Right Hip Flexion 4+/5    Right Hip Extension 4+/5    Right Hip External Rotation  5/5    Right Hip Internal Rotation 5/5    Right Hip ABduction 4+/5    Right Hip ADduction 4/5    Left Hip Flexion 4+/5   discomfort with resistance   Left Hip Extension 4+/5    Left Hip External Rotation 5/5    Left Hip Internal Rotation 5/5    Left Hip ABduction 4+/5    Left Hip ADduction 4/5    Right/Left Knee Left;Right    Right Knee Flexion 5/5    Right Knee Extension 5/5    Left Knee Flexion 5/5    Left Knee Extension 5/5    Right Ankle Dorsiflexion 5/5    Right Ankle Plantar Flexion 4+/5    Left Ankle Dorsiflexion 5/5    Left Ankle Plantar Flexion 4+/5      Flexibility   Soft Tissue Assessment /Muscle Length yes    Hamstrings WNL bilaterally      Palpation   SI assessment  tender to palpation at Rt SI joint line    Palpation comment tenderness to palpation at bilateral lumbar paraspinals, Lt achilles insertion, Lt gastroc, and with gentle mobilizations to joint of Lt mid foot      Special  Tests    Special Tests Sacrolliac Tests;Hip Special Tests    Sacroiliac Tests  Gaenslen's Test    Hip Special Tests  [facility]) Test;Hip Scouring      Sacral thrust    Findings Positive    Side Left      Gaenslen's test   Findings Positive    Side  Left      Luisa Hart ([facility]) Test   Findings Negative    Side Left      Hip Scouring   Findings Positive    Side Left                        Objective measurements completed on examination: See above findings.               PT Education - 07/06/21 1532     Education Details Access Code: [email or web address]    Person(s) Educated Patient    Methods Explanation;Demonstration;Tactile cues;Verbal cues;Handout    Comprehension Verbalized understanding;Returned demonstration;Verbal cues required;Tactile cues required              PT Short Term Goals - 07/06/21 1622       PT SHORT TERM GOAL #1   Title Patient will be independent with HEP for continued progression at home.    Time 4    Period Weeks    Status New    Target Date 08/03/21      PT SHORT TERM GOAL #2   Title Patient will report 50% resolution of hip and foot symptoms for improved functional activity tolerance.    Time 4    Period Weeks    Status New    Target Date 08/03/21               PT Long Term Goals - 07/06/21 1623       PT LONG TERM GOAL #1   Title Patient will be independent with advanced HEP for long term management of symptoms post D/C.    Time 8    Period Weeks    Status New    Target Date 08/31/21      PT LONG TERM  GOAL #2   Title Patient will report at least 80% resolution of hip and foot symptoms for improved functional activity tolerance.    Time 8    Period Weeks    Status New    Target Date 08/31/21                    Plan - 07/06/21 1553     Clinical Impression Statement Patient is a 35 y/o female referred due to Lt hip and foot pain which began after giving birth 04/28/2021. Patient further  endorses abdominal pain which began at the same time. Patient demonstrates no significant LE strength impairments. However, she reports discomfort with resisted Lt hip flexion. She reports significant tenderness to palpation of Lt calcaneous and gastroc as well as joints of Lt midfoot. She further endorses pain palpation to bilateral lumbar paraspinals. Sacral thrust and Gaenslen's positive for Lt LE indicating possible SI involvement. She exhibits no significant abnormalities when maintianing single limb stance. Patient would benefit from skilled therapeutic intervention to address impairments for decreased pain and improved activity tolerance. Would also benefit from additional referral to pelvic floor specialty.    Stability/Clinical Decision Making Stable/Uncomplicated    Clinical Decision Making Low    Rehab Potential Excellent    PT Frequency 1x / week    PT Duration 8 weeks    PT Treatment/Interventions ADLs/Self Care Home Management;Cryotherapy;Electrical Stimulation;Iontophoresis 4mg /ml Dexamethasone;Moist Heat;Ultrasound;Functional mobility training;Therapeutic activities;Therapeutic exercise;Neuromuscular re-education;Patient/family education;Manual techniques;Dry needling;Taping;Spinal Manipulations;Joint Manipulations    PT Next Visit Plan complete FOTO, begin gentle core strengthening, continue glute strengthening and global lumbar/hip mobility; dry needling?    PT Home Exercise Plan Access Code: [email or web address]    Consulted and Agree with Plan of Care Patient             Patient will benefit from skilled therapeutic intervention in order to improve the following deficits and impairments:  Decreased activity tolerance, Difficulty walking, Increased muscle spasms, Increased fascial restricitons, Pain  Visit Diagnosis: Cramp and spasm - Plan: PT plan of care cert/re-cert  Pain in left hip - Plan: PT plan of care cert/re-cert  Pain in left ankle and joints of left foot - Plan: PT plan of  care cert/re-cert  Stiffness of left hip, not elsewhere classified - Plan: PT plan of care cert/re-cert     Problem List Patient Active Problem List   Diagnosis Date Noted   Language barrier 09/02/2020   11/02/2020 PT, DPT  07/06/21 4:38 PM   [facility] [address] [place], [name], [place] Phone: [phone]   Fax:  [phone]  Name: Jacky Hartung MRN: [name] Date of Birth: [birth year]

## 2021-07-09 ENCOUNTER — Ambulatory Visit: Payer: Medicaid Other | Admitting: Family Medicine

## 2021-07-09 ENCOUNTER — Ambulatory Visit (INDEPENDENT_AMBULATORY_CARE_PROVIDER_SITE_OTHER): Payer: Medicaid Other | Admitting: Family Medicine

## 2021-07-09 ENCOUNTER — Other Ambulatory Visit: Payer: Self-pay

## 2021-07-09 ENCOUNTER — Encounter: Payer: Self-pay | Admitting: General Practice

## 2021-07-09 VITALS — BP 122/79 | HR 73 | Wt 162.0 lb

## 2021-07-09 DIAGNOSIS — Z30430 Encounter for insertion of intrauterine contraceptive device: Secondary | ICD-10-CM

## 2021-07-09 DIAGNOSIS — Z01812 Encounter for preprocedural laboratory examination: Secondary | ICD-10-CM | POA: Diagnosis not present

## 2021-07-09 DIAGNOSIS — Z789 Other specified health status: Secondary | ICD-10-CM

## 2021-07-09 LAB — POCT URINE PREGNANCY: Preg Test, Ur: NEGATIVE

## 2021-07-09 MED ORDER — LEVONORGESTREL 20.1 MCG/DAY IU IUD
1.0000 | INTRAUTERINE_SYSTEM | Freq: Once | INTRAUTERINE | Status: AC
  Administered 2021-07-09: 1 via INTRAUTERINE

## 2021-07-09 NOTE — Progress Notes (Signed)
IUD Procedure Note Patient identified, informed consent performed, signed copy in chart, time out was performed.  Urine pregnancy test negative.  Speculum placed in the vagina.  Cervix visualized.  Cleaned with Betadine x 2.  Paracervical block placed with Lidocaine 2% with epinephrine 57mL at the 12 o'clock position. Cervix grasped anteriorly with a single tooth tenaculum.  Uterus sounded to 10 cm.  Liletta  IUD placed per manufacturer's recommendations.  Strings trimmed to 3 cm. Tenaculum was removed, good hemostasis noted.  Patient tolerated procedure well.   Patient given post procedure instructions and Liletta care card with expiration date.  Patient is asked to check IUD strings periodically and follow up in 4-6 weeks for IUD check.  Perineum healing well.

## 2021-07-09 NOTE — Progress Notes (Signed)
CAP interpreter Joni Reining.

## 2021-07-10 ENCOUNTER — Other Ambulatory Visit: Payer: Self-pay | Admitting: Family Medicine

## 2021-07-12 ENCOUNTER — Other Ambulatory Visit: Payer: Self-pay

## 2021-07-12 ENCOUNTER — Emergency Department (HOSPITAL_BASED_OUTPATIENT_CLINIC_OR_DEPARTMENT_OTHER)
Admission: EM | Admit: 2021-07-12 | Discharge: 2021-07-12 | Disposition: A | Discharge status: Home / Self Care | Payer: Medicaid Other | Attending: Emergency Medicine | Admitting: Emergency Medicine

## 2021-07-12 DIAGNOSIS — T8384XA Pain from genitourinary prosthetic devices, implants and grafts, initial encounter: Secondary | ICD-10-CM | POA: Diagnosis not present

## 2021-07-12 DIAGNOSIS — R1032 Left lower quadrant pain: Secondary | ICD-10-CM | POA: Diagnosis present

## 2021-07-12 MED ORDER — IBUPROFEN 400 MG PO TABS
600.0000 mg | ORAL_TABLET | Freq: Once | ORAL | Status: AC
  Administered 2021-07-12: 600 mg via ORAL
  Filled 2021-07-12: qty 1

## 2021-07-12 NOTE — ED Triage Notes (Signed)
Pt w/ recent IUD placement on Friday. Pt is on her period and now having pain.

## 2021-07-12 NOTE — ED Provider Notes (Signed)
[facility] EMERGENCY DEPARTMENT Provider Note   CSN: [id number] Arrival date & time: 07/12/21  1042     History Chief Complaint  Patient presents with   Abdominal Pain    Recent IUD placement    Miranda Caldwell is a 35 y.o. female.  The history is provided by the spouse. The history is limited by a language barrier. No language interpreter was used.  Abdominal Pain Pain location:  LLQ Pain quality: not cramping   Pain radiates to:  Does not radiate Pain severity:  Mild Onset quality:  Sudden Duration:  1 day Timing:  Constant Progression:  Unchanged Chronicity:  New Context: not awakening from sleep, not recent illness, not sick contacts and not trauma   Context comment:  Had hormonal IUD placed 2 days ago Relieved by:  Nothing Worsened by:  Nothing Ineffective treatments:  Acetaminophen Associated symptoms: vaginal bleeding (mild)   Associated symptoms: no chest pain, no chills, no cough, no dysuria, no fever, no hematuria, no nausea, no shortness of breath, no sore throat and no vomiting       No past medical history on file.  Patient Active Problem List   Diagnosis Date Noted   Language barrier 09/02/2020    No past surgical history on file.   OB History     Gravida  1   Para  1   Term  1   Preterm      AB      Living  1      SAB      IAB      Ectopic      Multiple  0   Live Births  1           Family History  Problem Relation Age of Onset   Stroke Neg Hx    Obesity Neg Hx    Hypertension Neg Hx     Social History   Tobacco Use   Smoking status: Never   Smokeless tobacco: Never  Vaping Use   Vaping Use: Never used  Substance Use Topics   Alcohol use: Never   Drug use: Never    Home Medications Prior to Admission medications   Medication Sig Start Date End Date Taking? Authorizing Provider  hydrochlorothiazide (MICROZIDE) 12.5 MG capsule Take 1 capsule (12.5 mg total) by mouth daily. Patient not taking:  No sig reported 06/11/21   Levie Heritage, DO  norethindrone (MICRONOR) 0.35 MG tablet Take 1 tablet (0.35 mg total) by mouth daily. Patient not taking: No sig reported 06/11/21   Levie Heritage, DO  Prenatal Vit-Fe Fumarate-FA (PRENATAL MULTIVITAMIN) TABS tablet Take 1 tablet by mouth daily at 12 noon.    [provider]    Allergies    Patient has no known allergies.  Review of Systems   Review of Systems  Constitutional:  Negative for chills and fever.  HENT:  Negative for ear pain and sore throat.   Eyes:  Negative for pain and visual disturbance.  Respiratory:  Negative for cough and shortness of breath.   Cardiovascular:  Negative for chest pain and palpitations.  Gastrointestinal:  Positive for abdominal pain. Negative for nausea and vomiting.  Genitourinary:  Positive for vaginal bleeding (mild). Negative for dysuria and hematuria.  Musculoskeletal:  Negative for arthralgias and back pain.  Skin:  Negative for color change and rash.  Neurological:  Negative for seizures and syncope.  All other systems reviewed and are negative.  Physical Exam Updated Vital  Signs BP 120/60 (BP Location: Left Arm)   Pulse 98   Temp 98.6 F (37 C) (Oral)   Resp 18   Ht 5\' 4"  (1.626 m)   Wt 73 kg   SpO2 98%   BMI 27.62 kg/m   Physical Exam Vitals and nursing note reviewed. Exam conducted with a chaperone present.  HENT:     Head: Normocephalic and atraumatic.  Eyes:     General: No scleral icterus. Pulmonary:     Effort: Pulmonary effort is normal. No respiratory distress.  Abdominal:     General: There is no distension.     Palpations: Abdomen is soft.     Tenderness: There is no abdominal tenderness.  Genitourinary:    General: Normal vulva.     Exam position: Lithotomy position.     Cervix: Cervical bleeding present.     Comments: Moderate bleeding. IUD strings visible Musculoskeletal:     Cervical back: Normal range of motion.  Skin:    General: Skin is warm  and dry.  Neurological:     Mental Status: She is alert.  Psychiatric:        Mood and Affect: Mood normal.    ED Results / Procedures / Treatments   Labs (all labs ordered are listed, but only abnormal results are displayed) Labs Reviewed - No data to display  EKG None  Radiology No results found.  Procedures Procedures   Medications Ordered in ED Medications - No data to display  ED Course  I have reviewed the triage vital signs and the nursing notes.  Pertinent labs & imaging results that were available during my care of the patient were reviewed by me and considered in my medical decision making (see chart for details).    MDM Rules/Calculators/A&P                           [name] had an IUD placed a few days ago and presents with mild left lower quadrant pain.  Abdomen overall soft, nontender, and reassuring.  Pelvic exam revealed mild bleeding and visible IUD strings.  Symptoms and exam are consistent with expected post placement symptoms.  Symptomatic treatment advised and return precautions were given. Final Clinical Impression(s) / ED Diagnoses Final diagnoses:  Pain due to intrauterine contraceptive device (IUD), initial encounter [facility])    Rx / DC Orders ED Discharge Orders     None        [facility], MD 07/12/21 1331

## 2021-07-13 ENCOUNTER — Ambulatory Visit: Payer: Medicaid Other

## 2021-07-14 ENCOUNTER — Other Ambulatory Visit: Payer: Self-pay

## 2021-07-14 MED ORDER — IBUPROFEN 600 MG PO TABS: 600.0000 mg | ORAL_TABLET | Freq: Four times a day (QID) | ORAL | 1 refills | Status: DC | PRN

## 2021-07-20 ENCOUNTER — Encounter: Payer: Self-pay | Admitting: Physical Therapy

## 2021-07-20 ENCOUNTER — Ambulatory Visit: Payer: Medicaid Other | Admitting: Physical Therapy

## 2021-07-20 ENCOUNTER — Other Ambulatory Visit: Payer: Self-pay

## 2021-07-20 ENCOUNTER — Other Ambulatory Visit: Payer: Self-pay | Admitting: Family Medicine

## 2021-07-20 DIAGNOSIS — R252 Cramp and spasm: Secondary | ICD-10-CM

## 2021-07-20 DIAGNOSIS — M25552 Pain in left hip: Secondary | ICD-10-CM

## 2021-07-20 DIAGNOSIS — M25572 Pain in left ankle and joints of left foot: Secondary | ICD-10-CM

## 2021-07-20 DIAGNOSIS — N8189 Other female genital prolapse: Secondary | ICD-10-CM

## 2021-07-20 DIAGNOSIS — M25652 Stiffness of left hip, not elsewhere classified: Secondary | ICD-10-CM

## 2021-07-20 NOTE — Therapy (Signed)
[facility] [address] [place], [place], [address] Phone: [phone]   Fax:  [phone]  Physical Therapy Treatment  Patient Details  Name: Miranda Caldwell MRN: [id number] Date of Birth: 29-Oct-1986 Referring Provider (PT): Candelaria Celeste, DO   Encounter Date: 07/20/2021   PT End of Session - 07/20/21 1144     Visit Number 2    Date for PT Re-Evaluation 08/31/21    Authorization Type [employer] MCD    Authorization Time Period requested 8 visits 07/06/2021 - 08/31/2021    PT Start Time 1100    PT Stop Time 1143    PT Time Calculation (min) 43 min    Activity Tolerance Patient tolerated treatment well    Behavior During Therapy [facility] for tasks assessed/performed             History reviewed. No pertinent past medical history.  History reviewed. No pertinent surgical history.  There were no vitals filed for this visit.   Subjective Assessment - 07/20/21 1056     Subjective The stretch for my foot made my foot hurt more.    Patient is accompained by: Interpreter   [place] speaking   Limitations Standing    Currently in Pain? Yes                               [facility] Adult PT Treatment/Exercise - 07/20/21 0001       Self-Care   Self-Care Other Self-Care Comments    Other Self-Care Comments  discussion around using abdominal binder vs not using: intro of core as her "internal brace" and to focus on incorporating these muscles as much as possible, discussion about pelvic floor dysfunction and benefits of working with pelvic floor PT      Neuro Re-ed    Neuro Re-ed Details  sidelying TA indrawing with TC/VC 10x3" holds, add SL ball squeeze with TA 5x5" holds      Exercises   Exercises Lumbar;Knee/Hip      Lumbar Exercises: Supine   Bridge 10 reps    Bridge Limitations with ball squeeze, PT cued exhale on bridge for improved TA    Straight Leg Raise 10 reps    Straight Leg Raises Limitations  with TA and exhale on effort TC and VC      Manual Therapy   Manual Therapy Soft tissue mobilization    Soft tissue mobilization bil obliques, thoracic and lumbar paraspinals and glut med in SL, bil                      PT Short Term Goals - 07/06/21 1622       PT SHORT TERM GOAL #1   Title Patient will be independent with HEP for continued progression at home.    Time 4    Period Weeks    Status New    Target Date 08/03/21      PT SHORT TERM GOAL #2   Title Patient will report 50% resolution of hip and foot symptoms for improved functional activity tolerance.    Time 4    Period Weeks    Status New    Target Date 08/03/21               PT Long Term Goals - 07/06/21 1623       PT LONG TERM GOAL #1   Title Patient will be independent with advanced  HEP for long term management of symptoms post D/C.    Time 8    Period Weeks    Status New    Target Date 08/31/21      PT LONG TERM GOAL #2   Title Patient will report at least 80% resolution of hip and foot symptoms for improved functional activity tolerance.    Time 8    Period Weeks    Status New    Target Date 08/31/21                   Plan - 07/20/21 1251     Clinical Impression Statement Pt arrives for first follow up session since intial visit reporting ongoing pain along sides of trunk that wraps anteriorly along lower abdomen.  She has tenderness and tension along bil obliques, rectus abdominus, and Lt thoracic and lumbar paraspinals.  PT performed STM to bil trunk to address tension followed by neuro re-ed with heavy TC and VC to improved recruitment of TA with breath coordination.  Pt able to find TA in supine and SL by end of session and encouraged to use this muscle as much as she could with activity and within HEP.  Pt asked if she needed to return to using abdominal binder so PT educated Pt that her deep core muscles serve that role but she may use it as needed with ongoing  concentration of activation of core within binder.  Pt has ongoing concerns about pelvic floor weakness in her post-partum state so PT contacted referring MD to see about including a pelvic diagnosis on Rx to be able to address all of Pt's concerns.  PT did not have time to address Lt heel/foot pain in today's session.    PT Frequency 1x / week    PT Duration 8 weeks    PT Treatment/Interventions ADLs/Self Care Home Management;Cryotherapy;Electrical Stimulation;Iontophoresis 4mg /ml Dexamethasone;Moist Heat;Ultrasound;Functional mobility training;Therapeutic activities;Therapeutic exercise;Neuromuscular re-education;Patient/family education;Manual techniques;Dry needling;Taping;Spinal Manipulations;Joint Manipulations    PT Next Visit Plan did new Rx come to include pelvic floor, revisit beginning core strengthening, continue glute strengthening and global lumbar/hip mobility    PT Home Exercise Plan Access Code: [email or web address]    Consulted and Agree with Plan of Care Patient;Other (Comment)   interpreter            Patient will benefit from skilled therapeutic intervention in order to improve the following deficits and impairments:     Visit Diagnosis: Cramp and spasm  Pain in left hip  Pain in left ankle and joints of left foot  Stiffness of left hip, not elsewhere classified     Problem List Patient Active Problem List   Diagnosis Date Noted   Language barrier 09/02/2020    11/02/2020, PT 07/20/21 12:58 PM   [name], [name], [name], [place] Phone: [phone]   Fax:  [phone]  Name: Miranda Caldwell MRN: [name] Date of Birth: May 05, 1986

## 2021-07-24 ENCOUNTER — Other Ambulatory Visit: Payer: Self-pay | Admitting: Family Medicine

## 2021-08-04 ENCOUNTER — Other Ambulatory Visit: Payer: Self-pay

## 2021-08-04 ENCOUNTER — Encounter: Payer: Self-pay | Admitting: Physical Therapy

## 2021-08-04 ENCOUNTER — Ambulatory Visit: Payer: Medicaid Other | Attending: Family Medicine | Admitting: Physical Therapy

## 2021-08-04 DIAGNOSIS — R293 Abnormal posture: Secondary | ICD-10-CM | POA: Insufficient documentation

## 2021-08-04 DIAGNOSIS — M6281 Muscle weakness (generalized): Secondary | ICD-10-CM | POA: Insufficient documentation

## 2021-08-04 DIAGNOSIS — R2689 Other abnormalities of gait and mobility: Secondary | ICD-10-CM | POA: Insufficient documentation

## 2021-08-04 DIAGNOSIS — R279 Unspecified lack of coordination: Secondary | ICD-10-CM | POA: Insufficient documentation

## 2021-08-04 DIAGNOSIS — R252 Cramp and spasm: Secondary | ICD-10-CM | POA: Insufficient documentation

## 2021-08-04 NOTE — Therapy (Signed)
[facility] [address], [address] [place], [place], [address] Phone: [phone]   Fax:  [phone]  Physical Therapy Treatment  Patient Details  Name: Miranda Caldwell MRN: [id number] Date of Birth: 10/31/1986 Referring Provider (PT): Miranda Celeste, DO   Encounter Date: 08/04/2021   PT End of Session - 08/04/21 1145     Visit Number 3    Date for PT Re-Evaluation 08/31/21    Authorization Type UHC MCD    Authorization Time Period requested 8 visits 07/06/2021 - 08/31/2021    PT Start Time 1019    PT Stop Time 1100    PT Time Calculation (min) 41 min    Activity Tolerance Patient tolerated treatment well;Patient limited by pain    Behavior During Therapy [facility] for tasks assessed/performed             History reviewed. No pertinent past medical history.  History reviewed. No pertinent surgical history.  There were no vitals filed for this visit.   Subjective Assessment - 08/04/21 1139     Subjective Pt reports pain in Rt hip intermittently and pelvic pain with attempting to activate core    Patient is accompained by: Interpreter    Limitations Standing    Currently in Pain? Yes    Pain Score 5     Pain Location Pelvis   into Rt hip   Pain Orientation Lower    Pain Descriptors / Indicators Aching    Pain Type Acute pain;Post Delivery (post partum)                               [facility] Adult PT Treatment/Exercise - 08/04/21 0001       Self-Care   Self-Care Other Self-Care Comments    Other Self-Care Comments  pt had her 68 month old infant with her this session and pt educated on improved erogonomics with holding baby in standing, sitting and with nursing erogonomics to improve posture and decreased pain. Pt also educated on standing position with improved core activation to decrease lumbar lordosis.      Exercises   Exercises Knee/Hip;Lumbar      Lumbar Exercises: Supine   Clam 20 reps     Clam Limitations 2x10 black loop first set then no band for improved technique    Bridge 10 reps    Other Supine Lumbar Exercises TA activation 2x10 with intermittent manual assist to complete correctly and VC needed throughout      Manual Therapy   Manual Therapy Soft tissue mobilization    Soft tissue mobilization bil obliques, thoracic and lumbar paraspinals in prone for improved tissue mobility and decreased pain                    PT Education - 08/04/21 1145     Education Details Pt educated on body mechanics with infant to decrease pain and improve proper posture and strethening with all mobility.    Person(s) Educated Patient    Methods Explanation;Demonstration;Tactile cues;Verbal cues    Comprehension Verbalized understanding;Returned demonstration              PT Short Term Goals - 07/06/21 1622       PT SHORT TERM GOAL #1   Title Patient will be independent with HEP for continued progression at home.    Time 4    Period Weeks    Status New    Target  Date 08/03/21      PT SHORT TERM GOAL #2   Title Patient will report 50% resolution of hip and foot symptoms for improved functional activity tolerance.    Time 4    Period Weeks    Status New    Target Date 08/03/21               PT Long Term Goals - 07/06/21 1623       PT LONG TERM GOAL #1   Title Patient will be independent with advanced HEP for long term management of symptoms post D/C.    Time 8    Period Weeks    Status New    Target Date 08/31/21      PT LONG TERM GOAL #2   Title Patient will report at least 80% resolution of hip and foot symptoms for improved functional activity tolerance.    Time 8    Period Weeks    Status New    Target Date 08/31/21                   Plan - 08/04/21 1146     Clinical Impression Statement Pt presenting to clinic with reports of continued pain in abdomen and Rt hip. Pt reports she feels she is not able to do alot at home or  exercise well due to pain and weakness. Pt also educated throughout session and with holding infant on proper lifting mechanics, core activation and glute activation for posture and decreased lumbar lordosis and ergonomics with sitting and standing and infant. Pt session also included strengthening of hips and core with moderate-max VC for TA activation and intermittant manual assist to improve technique and muscle activation. Pt also benefited from manual treatment at bil paraspinals at thoarcic and lumbar spine in prone and reported decreased pain at end of session.    Stability/Clinical Decision Making Stable/Uncomplicated    Clinical Decision Making Low    Rehab Potential Excellent    PT Frequency 1x / week    PT Duration 8 weeks    PT Treatment/Interventions ADLs/Self Care Home Management;Cryotherapy;Electrical Stimulation;Iontophoresis 4mg /ml Dexamethasone;Moist Heat;Ultrasound;Functional mobility training;Therapeutic activities;Therapeutic exercise;Neuromuscular re-education;Patient/family education;Manual techniques;Dry needling;Taping;Spinal Manipulations;Joint Manipulations;Aquatic Therapy;Energy conservation;Passive range of motion;Scar mobilization    PT Next Visit Plan core and hip stretching/strengthening and spine stretching    PT Home Exercise Plan Access Code: [email or web address]    Consulted and Agree with Plan of Care Patient;Other (Comment)             Patient will benefit from skilled therapeutic intervention in order to improve the following deficits and impairments:  Decreased activity tolerance, Difficulty walking, Increased muscle spasms, Increased fascial restricitons, Pain  Visit Diagnosis: Muscle weakness (generalized)  Other abnormalities of gait and mobility  Lack of coordination     Problem List Patient Active Problem List   Diagnosis Date Noted   Language barrier 09/02/2020    11/02/2020, PT, DPT 08/04/2210:51 AM   [facility] [address], [address] [place], [name], [place] Phone: [phone]   Fax:  [phone]  Name: Miranda Caldwell MRN: [name] Date of Birth: 07/20/1986

## 2021-08-05 ENCOUNTER — Encounter: Payer: Self-pay | Admitting: Family Medicine

## 2021-08-05 ENCOUNTER — Ambulatory Visit (INDEPENDENT_AMBULATORY_CARE_PROVIDER_SITE_OTHER): Payer: Medicaid Other | Admitting: Family Medicine

## 2021-08-05 VITALS — BP 126/77 | HR 91 | Wt 163.0 lb

## 2021-08-05 DIAGNOSIS — Z30431 Encounter for routine checking of intrauterine contraceptive device: Secondary | ICD-10-CM | POA: Diagnosis not present

## 2021-08-05 NOTE — Progress Notes (Signed)
   Subjective:   Patient Name: Miranda Caldwell, female   DOB: [birth year], 35 y.o.  MRN: [id number]  HPI Patient here for an IUD check.  She had the Liletta IUD placed 1 month ago.  She reports occasional spotting. No cramping.   Review of Systems  Constitutional: Negative for fever and chills.  Gastrointestinal: Negative for abdominal pain.  Genitourinary: Negative for vaginal discharge, vaginal pain, pelvic pain and dyspareunia.        Objective:   Physical Exam  Constitutional: She appears well-developed and well-nourished.  HENT:  Head: Normocephalic and atraumatic.  Abdominal: Soft. There is no tenderness. There is no guarding.  Genitourinary: There is no rash, tenderness or lesion on the right labia. There is no rash, tenderness or lesion on the left labia. No erythema or tenderness in the vagina. No foreign body around the vagina. No signs of injury around the vagina. No vaginal discharge found.    Skin: Skin is warm and dry.  Psychiatric: She has a normal mood and affect. Her behavior is normal. Judgment and thought content normal.       Assessment & Plan:  1. IUD check up IUD in place.  Pt to call with any other problems.  Patient to send message in 2 weeks if still having spotting. Recheck in 1 year.

## 2021-08-10 ENCOUNTER — Other Ambulatory Visit: Payer: Self-pay

## 2021-08-10 ENCOUNTER — Ambulatory Visit: Payer: Medicaid Other | Attending: Family Medicine

## 2021-08-10 DIAGNOSIS — M6281 Muscle weakness (generalized): Secondary | ICD-10-CM | POA: Diagnosis not present

## 2021-08-10 DIAGNOSIS — M25552 Pain in left hip: Secondary | ICD-10-CM | POA: Diagnosis present

## 2021-08-10 DIAGNOSIS — R2689 Other abnormalities of gait and mobility: Secondary | ICD-10-CM | POA: Insufficient documentation

## 2021-08-10 DIAGNOSIS — M25652 Stiffness of left hip, not elsewhere classified: Secondary | ICD-10-CM | POA: Insufficient documentation

## 2021-08-10 DIAGNOSIS — M25572 Pain in left ankle and joints of left foot: Secondary | ICD-10-CM | POA: Insufficient documentation

## 2021-08-10 DIAGNOSIS — R252 Cramp and spasm: Secondary | ICD-10-CM | POA: Diagnosis present

## 2021-08-10 DIAGNOSIS — R279 Unspecified lack of coordination: Secondary | ICD-10-CM | POA: Insufficient documentation

## 2021-08-10 NOTE — Therapy (Signed)
[facility] [address] [place], [place], [address] Phone: [phone]   Fax:  [phone]  Physical Therapy Treatment  Patient Details  Name: Miranda Caldwell MRN: [id number] Date of Birth: [birth year] Referring Provider (PT): Candelaria Celeste, DO   Encounter Date: 08/10/2021   PT End of Session - 08/10/21 1143     Visit Number 4    Date for PT Re-Evaluation 08/31/21    Authorization Type [employer] MCD    Authorization Time Period 27 visits combined PT/OT/ST    Authorization - Visit Number 4    Authorization - Number of Visits 27    PT Start Time 1100    PT Stop Time 1142    PT Time Calculation (min) 42 min    Activity Tolerance Patient tolerated treatment well    Behavior During Therapy [facility] for tasks assessed/performed             History reviewed. No pertinent past medical history.  History reviewed. No pertinent surgical history.  There were no vitals filed for this visit.   Subjective Assessment - 08/10/21 1058     Subjective I felt better after last time.    Currently in Pain? Yes    Pain Score 7    Lt foot pain 5/10, worse with standing.   Pain Location Back    Pain Orientation Lower    Pain Descriptors / Indicators Aching    Pain Type Acute pain;Post Delivery (post partum)    Pain Onset More than a month ago    Pain Frequency Intermittent    Aggravating Factors  standing for a long time    Pain Relieving Factors laying down                               [facility] Adult PT Treatment/Exercise - 08/10/21 0001       Lumbar Exercises: Stretches   Active Hamstring Stretch 2 reps;20 seconds;Left;Right    Active Hamstring Stretch Limitations tactile cues for gentle stretch and relax shoulders    Single Knee to Chest Stretch 2 reps;20 seconds    Lower Trunk Rotation 3 reps;20 seconds      Lumbar Exercises: Seated   Other Seated Lumbar Exercises ball roll outs x 5      Lumbar  Exercises: Supine   Ab Set 15 reps    AB Set Limitations with ball squeeze 5"hold    Clam 20 reps    Clam Limitations 2x10 black loop first set then no band for improved technique      Lumbar Exercises: Sidelying   Clam 10 reps      Manual Therapy   Manual Therapy Soft tissue mobilization    Manual therapy comments Addaday to bil gluteals                     PT Education - 08/10/21 1142     Education Details Access Code: [email or web address] new exercise code created due to other one not working.  PT empahsized importance of compliance with HEP    Person(s) Educated Patient    Methods Explanation;Demonstration;Handout    Comprehension Verbalized understanding;Returned demonstration              PT Short Term Goals - 07/06/21 1622       PT SHORT TERM GOAL #1   Title Patient will be independent with HEP for continued progression at home.  Time 4    Period Weeks    Status New    Target Date 08/03/21      PT SHORT TERM GOAL #2   Title Patient will report 50% resolution of hip and foot symptoms for improved functional activity tolerance.    Time 4    Period Weeks    Status New    Target Date 08/03/21               PT Long Term Goals - 07/06/21 1623       PT LONG TERM GOAL #1   Title Patient will be independent with advanced HEP for long term management of symptoms post D/C.    Time 8    Period Weeks    Status New    Target Date 08/31/21      PT LONG TERM GOAL #2   Title Patient will report at least 80% resolution of hip and foot symptoms for improved functional activity tolerance.    Time 8    Period Weeks    Status New    Target Date 08/31/21                   Plan - 08/10/21 1119     Clinical Impression Statement Pt presenting to clinic with reports of continued pain in abdomen and Rt hip. Pt reports she feels she is not able to do alot at home or exercise well due to pain and weakness. Pt also educated throughout session and with  holding infant on proper lifting mechanics, core activation and glute activation for posture and decreased lumbar lordosis and ergonomics with sitting and standing and infant. Pt session also included strengthening of hips and core with moderate-max VC for TA activation and intermittant manual assist to improve technique and muscle activation. Pt also benefited from manual treatment at bil paraspinals at thoarcic and lumbar spine in prone and reported decreased pain at end of session.  Pt will continue to benefit from skilled PT to address lumbopelvic strength, flexibility and manual therapy to address tissue mobility.    PT Frequency 1x / week    PT Duration 8 weeks    PT Treatment/Interventions ADLs/Self Care Home Management;Cryotherapy;Electrical Stimulation;Iontophoresis 4mg /ml Dexamethasone;Moist Heat;Ultrasound;Functional mobility training;Therapeutic activities;Therapeutic exercise;Neuromuscular re-education;Patient/family education;Manual techniques;Dry needling;Taping;Spinal Manipulations;Joint Manipulations;Aquatic Therapy;Energy conservation;Passive range of motion;Scar mobilization    PT Next Visit Plan core and hip stretching/strengthening and spine stretching.  Pt will have pelvic floor evalution later this month.    PT Home Exercise Plan Access Code: [email or web address]    Recommended Other Services initial cert is signed    Consulted and Agree with Plan of Care Patient;Other (Comment)             Patient will benefit from skilled therapeutic intervention in order to improve the following deficits and impairments:  Decreased activity tolerance, Difficulty walking, Increased muscle spasms, Increased fascial restricitons, Pain  Visit Diagnosis: Muscle weakness (generalized)  Other abnormalities of gait and mobility  Cramp and spasm  Lack of coordination  Pain in left hip  Pain in left ankle and joints of left foot  Stiffness of left hip, not elsewhere classified     Problem  List Patient Active Problem List   Diagnosis Date Noted   Language barrier 09/02/2020     11/02/2020, PT 08/10/21 11:47 AM  [facility] [address] [place], [name], [place] Phone: [phone]   Fax:  [phone]  Name: Jamilette Suchocki MRN:  [id number] Date of Birth: 1986/11/19

## 2021-08-10 NOTE — Patient Instructions (Signed)
Access Code: [email or web address] URL: [email or web address] Date: 08/10/2021 Prepared by: Tresa Endo  Exercises Supine Single Knee to Chest Stretch - 3 x daily - 7 x weekly - 1 sets - 3 reps - 20 hold Child's Pose Stretch - 2 x daily - 7 x weekly - 1 sets - 3 reps - 20 hold Hooklying Isometric Clamshell - 1 x daily - 7 x weekly - 2 sets - 10 reps Supine Bridge with Mini Swiss Ball Between Knees - 1 x daily - 7 x weekly - 2 sets - 10 reps - 5 hold Gastroc Stretch on Wall - 2 x daily - 7 x weekly - 1 sets - 3 reps - 20 hold

## 2021-08-17 ENCOUNTER — Other Ambulatory Visit: Payer: Self-pay

## 2021-08-17 ENCOUNTER — Encounter: Payer: Self-pay | Admitting: Physical Therapy

## 2021-08-17 ENCOUNTER — Ambulatory Visit: Payer: Medicaid Other | Admitting: Physical Therapy

## 2021-08-17 DIAGNOSIS — M6281 Muscle weakness (generalized): Secondary | ICD-10-CM | POA: Diagnosis not present

## 2021-08-17 DIAGNOSIS — R293 Abnormal posture: Secondary | ICD-10-CM

## 2021-08-17 DIAGNOSIS — R279 Unspecified lack of coordination: Secondary | ICD-10-CM

## 2021-08-17 NOTE — Therapy (Addendum)
[facility] [address] [place], [place], [address] Phone: [phone]   Fax:  [phone]  Physical Therapy Treatment  Patient Details  Name: Miranda Caldwell MRN: [id number] Date of Birth: 12/05/1985 Referring Provider (PT): Loma Boston, DO   Encounter Date: 08/17/2021   PT End of Session - 08/17/21 1109     Visit Number 5    Date for PT Re-Evaluation 08/31/21    Authorization Type U[employer] MCD    Authorization Time Period 27 visits combined PT/OT/ST    Authorization - Visit Number 5    Authorization - Number of Visits 27    PT Start Time 9675    PT Stop Time 1125    PT Time Calculation (min) 42 min    Activity Tolerance Patient tolerated treatment well    Behavior During Therapy [facility] for tasks assessed/performed             History reviewed. No pertinent past medical history.  History reviewed. No pertinent surgical history.  There were no vitals filed for this visit.   Subjective Assessment - 08/17/21 1050     Subjective Pt reports pain in abdomen continues but has improved over time, pt reports she has pain with intrecourse with penetration and deeper pain, still nursing, no vaginal dryness and is 3 months postpartum. Pt reports urine leakage with sneezing, coughing and with first void of the day.    Patient is accompained by: Interpreter    Limitations Standing    Currently in Pain? Yes    Pain Score 7     Pain Location Pelvis    Pain Orientation Lower;Left;Right;Anterior;Posterior    Pain Descriptors / Indicators Cramping    Pain Type Acute pain;Post Delivery (post partum)    Pain Onset More than a month ago    Pain Frequency Constant                               [facility] Adult PT Treatment/Exercise - 08/17/21 0001       Exercises   Exercises Knee/Hip;Lumbar      Lumbar Exercises: Stretches   Quadruped Mid Back Stretch --    Other Lumbar Stretch Exercise foaml roller on  bil glutes, pirformis x5 each      Lumbar Exercises: Supine   Clam 20 reps    Clam Limitations 2x10 black loop first set then no band for improved technique    Bridge 10 reps    Other Supine Lumbar Exercises TA activation 2x10 with intermittent VC needed throughout, improved recall of this compared to first session.    Other Supine Lumbar Exercises same side knee/arm press 2x10 each side      Lumbar Exercises: Quadruped   Madcat/Old Horse 10 reps    Other Quadruped Lumbar Exercises childs pose x45s, lateral childs pose bil sides x45s    Other Quadruped Lumbar Exercises needle threaders x45s each side                     PT Education - 08/17/21 1108     Education Details Pt educated on breathing mechanics for all exercises for decreased strain on pelvic floor. Access Code: [email or web address]    Person(s) Educated Patient    Methods Explanation;Demonstration;Tactile cues    Comprehension Verbalized understanding;Returned demonstration              PT Short Term Goals - 07/06/21 1622  PT SHORT TERM GOAL #1   Title Patient will be independent with HEP for continued progression at home.    Time 4    Period Weeks    Status New    Target Date 08/03/21      PT SHORT TERM GOAL #2   Title Patient will report 50% resolution of hip and foot symptoms for improved functional activity tolerance.    Time 4    Period Weeks    Status New    Target Date 08/03/21               PT Long Term Goals - 07/06/21 1623       PT LONG TERM GOAL #1   Title Patient will be independent with advanced HEP for long term management of symptoms post D/C.    Time 8    Period Weeks    Status New    Target Date 08/31/21      PT LONG TERM GOAL #2   Title Patient will report at least 80% resolution of hip and foot symptoms for improved functional activity tolerance.    Time 8    Period Weeks    Status New    Target Date 08/31/21                   Plan - 08/17/21 1111      Clinical Impression Statement Pt presenting to clinic reporting some improvement with abdominal pain since child birth however does still have pain and has pain with intercourse and urinary incontinence with coughing, sneezing. Pt session focused on core and hip strengthening and stretching, proper breathing mechanics to decrease strain on pelvic floor, improved TA activation with activities. Interpreter present on video call at start of session, then in person interpreter present to continue services throughout session. Pt continued to demonstrate decreased coordination of breathing mechanics, TA activation wit hneed of cues to improve and proper activate, decreased flexibility throughout, decreased rib mobility, decreased spinal mobility and decreased strength globally.    Personal Factors and Comorbidities Fitness;Comorbidity 1;Time since onset of injury/illness/exacerbation    Comorbidities postpartum    Examination-Participation Restrictions Interpersonal Relationship;Community Activity    Stability/Clinical Decision Making Stable/Uncomplicated    Clinical Decision Making Low    Rehab Potential Excellent    PT Frequency 1x / week    PT Duration 8 weeks    PT Treatment/Interventions ADLs/Self Care Home Management;Cryotherapy;Electrical Stimulation;Iontophoresis 60m/ml Dexamethasone;Moist Heat;Ultrasound;Functional mobility training;Therapeutic activities;Therapeutic exercise;Neuromuscular re-education;Patient/family education;Manual techniques;Dry needling;Taping;Spinal Manipulations;Joint Manipulations;Aquatic Therapy;Energy conservation;Passive range of motion;Scar mobilization    PT Next Visit Plan core and hip stretching/strengthening and spine stretching.  Pt will have pelvic floor evalution later this month.    PT Home Exercise Plan Access Code: [email or web address]   Consulted and Agree with Plan of Care Patient             Patient will benefit from skilled therapeutic intervention in order to  improve the following deficits and impairments:  Decreased activity tolerance, Difficulty walking, Increased muscle spasms, Increased fascial restricitons, Pain  Visit Diagnosis: Muscle weakness (generalized)  Abnormal posture  Lack of coordination     Problem List Patient Active Problem List   Diagnosis Date Noted   Language barrier 09/02/2020    HStacy Gardner PT, DPT 08/17/2210:32 AM   PHYSICAL THERAPY DISCHARGE SUMMARY  Visits from Start of Care: 5  Current functional level related to goals / functional outcomes: Pt to be evaluated for pelvic floor, new referral received as per  pt's reported symptoms being more pelvic floor related.    Remaining deficits: To be assessed with pelvic floor evaluation.    Education / Equipment: HEP   Patient agrees to discharge. Patient goals were partially met. Patient is being discharged due to the patient's request.   [facility] [address], [place], [place], [address] Phone: [phone]   Fax:  [phone]  Name: Miranda Caldwell MRN: [id number] Date of Birth: [birth year]

## 2021-08-20 ENCOUNTER — Encounter: Payer: Self-pay | Admitting: Physical Therapy

## 2021-08-20 ENCOUNTER — Other Ambulatory Visit: Payer: Self-pay

## 2021-08-20 ENCOUNTER — Ambulatory Visit: Payer: Medicaid Other | Admitting: Physical Therapy

## 2021-08-20 DIAGNOSIS — M6281 Muscle weakness (generalized): Secondary | ICD-10-CM | POA: Diagnosis not present

## 2021-08-20 DIAGNOSIS — R252 Cramp and spasm: Secondary | ICD-10-CM

## 2021-08-20 DIAGNOSIS — R279 Unspecified lack of coordination: Secondary | ICD-10-CM

## 2021-08-20 NOTE — Therapy (Signed)
[facility] [address] [place], [place], [address] Phone: [phone]   Fax:  [phone]  Physical Therapy Evaluation  Patient Details  Name: Miranda Caldwell MRN: [id number] Date of Birth: 03-05-1986 Referring Provider (PT): Candelaria Celeste, DO   Encounter Date: 08/20/2021   PT End of Session - 08/20/21 1254     Visit Number 1   restated 08/20/21 for PF evalation and POC   Date for PT Re-Evaluation 12/20/21    Authorization Type [employer] MCD    Authorization Time Period 27 visits combined PT/OT/ST    Authorization - Visit Number 1    Authorization - Number of Visits 27    PT Start Time 1055    PT Stop Time 1138    PT Time Calculation (min) 43 min    Activity Tolerance Patient tolerated treatment well    Behavior During Therapy [facility] for tasks assessed/performed             History reviewed. No pertinent past medical history.  History reviewed. No pertinent surgical history.  There were no vitals filed for this visit.    Subjective Assessment - 08/20/21 1058     Subjective Pt reports pain in lower abdomen/upper pelvis with increased activity throughout this region.    Patient is accompained by: Interpreter    Limitations Standing    How long can you sit comfortably? 30 mins    How long can you stand comfortably? 1 hour    How long can you walk comfortably? 10 mins    Patient Stated Goals to have less pain    Currently in Pain? Yes    Pain Score 7     Pain Location Pelvis    Pain Orientation Right;Left;Anterior;Lower;Mid    Pain Descriptors / Indicators Cramping    Pain Type Post Delivery (post partum)    Pain Onset More than a month ago    Pain Frequency Constant    Aggravating Factors  standing, driving, any one position for period of time, intercourse, lifting baby    Pain Relieving Factors laying down                [facility] PT Assessment - 08/20/21 0001       Assessment   Medical Diagnosis  N81.89 (ICD-10-CM) - Pelvic floor weakness in female    Referring Provider (PT) Candelaria Celeste, DO    Prior Therapy just ended PT for pain but more related to pelvic floor PT and then referred for this eval.      Precautions   Precautions Other (comment)    Precaution Comments [place] speaking, 3 months postpartum      Restrictions   Weight Bearing Restrictions No      Balance Screen   Has the patient fallen in the past 6 months No    Has the patient had a decrease in activity level because of a fear of falling?  No    Is the patient reluctant to leave their home because of a fear of falling?  No      Prior Function   Level of Independence Independent    Vocation Unemployed      Cognition   Overall Cognitive Status Within Functional Limits for tasks assessed      Sensation   Light Touch Appears Intact      Coordination   Gross Motor Movements are Fluid and Coordinated Yes    Fine Motor Movements are Fluid and Coordinated Yes  Functional Tests   Functional tests --      Single Leg Stance   Comments --      Posture/Postural Control   Posture/Postural Control Postural limitations    Postural Limitations Rounded Shoulders;Increased thoracic kyphosis;Posterior pelvic tilt      ROM / Strength   AROM / PROM / Strength AROM;Strength      AROM   Overall AROM Comments thoracic spine limited by 25% in extension, flexion, side bending and rotation, lumbar spine with same limitations.      Strength   Overall Strength Comments bil hip abduction, extension 4/5 and bil flexion and adduction 4+/5      Flexibility   Soft Tissue Assessment /Muscle Length yes    Hamstrings bil adductors and hamstring limited by 25%      Palpation   Palpation comment pt denied TTP at spinal segments, paraspinals, SIJ. Did have TTP at Rt,Lt and mid lower quadrants of abdomen.                 No emotional/communication barriers or cognitive limitation. Patient is motivated to learn.  Patient understands and agrees with treatment goals and plan. PT explains patient will be examined in standing, sitting, and lying down to see how their muscles and joints work. When they are ready, they will be asked to remove their underwear so PT can examine their perineum. The patient is also given the option of providing their own chaperone as one is not provided in our facility. The patient also has the right and is explained the right to defer or refuse any part of the evaluation or treatment including the internal exam. With the patient's consent, PT will use one gloved finger to gently assess the muscles of the pelvic floor, seeing how well it contracts and relaxes and if there is muscle symmetry. After, the patient will get dressed and PT and patient will discuss exam findings and plan of care. PT and patient discuss plan of care, schedule, attendance policy and HEP activities.         Objective measurements completed on examination: See above findings.     Pelvic Floor Special Questions - 08/20/21 0001     Prior Pelvic/Prostate Exam No    Are you Pregnant or attempting pregnancy? No    Prior Pregnancies Yes    Number of Pregnancies 1    Number of Vaginal Deliveries 1    Any difficulty with labor and deliveries No    Episiotomy Performed No    Currently Sexually Active Yes    Is this Painful Yes   deep and with penetration, dryness noted per pt. pt is currently still pumping as well.   History of sexually transmitted disease No    Marinoff Scale pain prevents any attempts at intercourse    Urinary Leakage Yes    How often weekly    Pad use no    Activities that cause leaking Coughing;Sneezing    Urinary urgency No    Urinary frequency no    Fecal incontinence No    Falling out feeling (prolapse) Yes    Activities that cause feeling of prolapse bending over to pick something up    Pelvic Floor Internal Exam patient identified and patient confirms consent for PT to perform  internal soft tissue work and muscle strength and integrity assessment    Exam Type Vaginal    Sensation WFL    Palpation tenderness reported at pubic bone, bil external bulbocavernosus and superficial transverse.  Strength fair squeeze, definite lift    Strength # of reps 4    Strength # of seconds 5    Tone increased                       PT Education - 08/20/21 1253     Education Details Pt educated on exam findings, HEP, POC and vaginal lubricants and moisturizers.    Person(s) Educated Patient   interpreter present throughout   Methods Explanation;Demonstration;Tactile cues;Verbal cues;Handout    Comprehension Verbalized understanding;Returned demonstration              PT Short Term Goals - 08/20/21 1309       PT SHORT TERM GOAL #1   Title Patient will be independent with HEP for continued progression at home.    Time 4    Period Weeks    Status New    Target Date 09/17/21      PT SHORT TERM GOAL #2   Title pt to report no more than 1 urinary leak per week with coughing or sneezing to decrease leakage.    Time 4    Period Weeks    Status New    Target Date 09/17/21      PT SHORT TERM GOAL #3   Title pt to report no more than 4/10 pain at lower abdomen/upper pelvis with activity to improve QOL.    Time 4    Period Weeks    Status New    Target Date 09/17/21      PT SHORT TERM GOAL #4   Title pt to demonstrate improved coordination of core/pelvic floor contract/relax and breathing pattern at least 50% of the time for decreased strain at pelvic floor    Time 4    Period Weeks    Status New    Target Date 09/17/21               PT Long Term Goals - 08/20/21 1312       PT LONG TERM GOAL #1   Title Patient will be independent with advanced HEP for long term management of symptoms post D/C.    Time 3    Period Months    Status New    Target Date 11/19/21      PT LONG TERM GOAL #2   Title pt to report no more than 1 urinary leak  per week with coughing or sneezing to decrease leakage.    Time 3    Period Months    Status New    Target Date 11/19/21      PT LONG TERM GOAL #3   Title pt to report no more than 2/10 pain at lower abdomen/upper pelvis with activity to improve QOL.    Time 3    Period Months    Status New    Target Date 11/19/21      PT LONG TERM GOAL #4   Title pt to demonstrate improved coordination of core/pelvic floor contract/relax and breathing pattern at least 75% of the time for decreased strain at pelvic floor    Time 3    Status New    Target Date 11/19/21      PT LONG TERM GOAL #5   Title pt to demonstrate at least 4/5 pelvic floor strength for improved urinary incontinence and decreased pain.    Time 3    Period Months    Status New    Target Date 11/19/21  Plan - 08/20/21 1258     Clinical Impression Statement Pt presenting to clinic with new evaluation for pelvic floor therapy this date as pt's symptoms postpartum may benefit from specialized treatment for pelvic floor. Pt reports abdominal pain with activity fairly constant with 7/10 cramping and only improves with lying down. Pt does report pain with intercourse with penetration and deep, endorsed vaginal dryness postpartum and still pumping. Pt educated on vaginal lubricants and moisturizers with handouts and samples given and interpreter present throughout entire evaluation. Pt found to have hip weakness, decreased rib expansion, impaired breathing mechanics, decreased TA activation ability, and does have uriniary incontinence with coughing, sneezing. Pt found to have increased tone in pelvic floor and weakness noted throughout. Pt reported she had IUD placed ~4 weeks ago and is still have vaginal bleeding but consented to pelvic assessment, unable to formally assess deeper pelvic floor muscles due to mild pain reported by pain and assessment halted. Pt reported no more pain at this time. Pt given handouts  for lubricants, moisturizers, HEP, and pelvic floor relaxation video. All questions answered at end of session, pt denied additional concerns or questions, and HEP went over as well. Pt would benefit from continued PT to address deficits found during eval.    Personal Factors and Comorbidities Fitness;Comorbidity 1;Time since onset of injury/illness/exacerbation    Comorbidities postpartum    Examination-Activity Limitations Continence    Examination-Participation Restrictions Interpersonal Relationship;Community Activity    Stability/Clinical Decision Making Stable/Uncomplicated    Clinical Decision Making Low    Rehab Potential Excellent    PT Frequency 1x / week    PT Duration 8 weeks    PT Treatment/Interventions ADLs/Self Care Home Management;Cryotherapy;Electrical Stimulation;Iontophoresis 4mg /ml Dexamethasone;Moist Heat;Ultrasound;Functional mobility training;Therapeutic activities;Therapeutic exercise;Neuromuscular re-education;Patient/family education;Manual techniques;Dry needling;Taping;Spinal Manipulations;Joint Manipulations;Aquatic Therapy;Energy conservation;Passive range of motion;Scar mobilization    PT Next Visit Plan core and hip stretching/strengthening and spine stretching.  Pt will have pelvic floor evalution later this month.    PT Home Exercise Plan Access Code: [email or web address]    Consulted and Agree with Plan of Care Patient             Patient will benefit from skilled therapeutic intervention in order to improve the following deficits and impairments:  Decreased activity tolerance, Difficulty walking, Increased muscle spasms, Increased fascial restricitons, Pain, Postural dysfunction, Decreased strength, Decreased mobility, Improper body mechanics, Impaired flexibility  Visit Diagnosis: Cramp and spasm - Plan: PT plan of care cert/re-cert  Muscle weakness (generalized) - Plan: PT plan of care cert/re-cert  Lack of coordination - Plan: PT plan of care  cert/re-cert     Problem List Patient Active Problem List   Diagnosis Date Noted   Language barrier 09/02/2020    11/02/2020, PT, DPT 09/22/221:15 PM   [facility] [address] [place], [name], [place] Phone: [phone]   Fax:  [phone]  Name: Miranda Caldwell MRN: [name] Date of Birth: [birth year]

## 2021-08-20 NOTE — Patient Instructions (Signed)
[email or web address]     Lubrication Used for intercourse to reduce friction Avoid ones that have glycerin, warming gels, tingling gels, icing or cooling gel, scented Avoid parabens due to a preservative similar to female sex hormone May need to be reapplied once or several times during sexual activity Can be applied to both partners genitals prior to vaginal penetration to minimize friction or irritation Prevent irritation and mucosal tears that cause post coital pain and increased the risk of vaginal and urinary tract infections Oil-based lubricants cannot be used with condoms due to breaking them down.  Least likely to irritate vaginal tissue.  Plant based-lubes are safe Silicone-based lubrication are thicker and last long and used for post-menopausal women  Vaginal Lubricators Here is a list of some suggested lubricators you can use for intercourse. Use the most hypoallergenic product.  You can place on you or your partner.  Slippery Stuff ( water based) Sylk or Sliquid Natural H2O ( good  if frequent UTI's)- walmart, amazon Sliquid organics silk-(aloe and silicone based ) [employer] (www.blossom-organics.com)- (aloe based ) Coconut oil, olive oil -not good with condoms  PJur Woman Nude- (water based) amazon Uberlube- ( silicon) Amazon Aloe Vera- Sprouts has an organic one Yes lubricant- (water based and has plant oil based similar to silicone) [employer] Platinum-Silicone, Target, [facility] Olive and Bee intimate cream-  www.oliveandbee.com.au Pink - Loews [employer] stuff Erosense Sync- walmart, amazon Coconu- EverydayCosmetics.no  Things to avoid in lubricants are glycerin, warming gels, tingling gels, icing or cooling  gels, and scented gels.  Also avoid Vaseline. KY jelly, Replens, and Astroglide contain chlorhexidine which kills good bacteria(lactobacilli)  Things to avoid in the vaginal area Do not use things to irritate the vulvar area No lotions- see  below Soaps you  can use :Aveeno, Calendula, Good Clean Love cleanser if needed. Must be gentle No deodorants No douches Good to sleep without underwear to let the vaginal area to air out No scrubbing: spread the lips to let warm water rinse over labias and pat dry  Creams that can be used on the Vulva Area V [employer], walmart Vital V Wild Yam Salve Julva- [employer] Botanical Pro-Meno Wild Yam Cream Coconut oil, olive oil Cleo by [employer] labial moisturizer -Amazon,  Desert Mathews Releveum ( lidocaine) or Desert [employer] Corporation Yes Moisturizer     Moisturizers They are used in the vagina to hydrate the mucous membrane that make up the vaginal canal. Designed to keep a more normal acid balance (ph) Once placed in the vagina, it will last between two to three days.  Use 2-3 times per week at bedtime  Ingredients to avoid is glycerin and fragrance, can increase chance of infection Should not be used just before sex due to causing irritation Most are gels administered either in a tampon-shaped applicator or as a vaginal suppository. They are non-hormonal.   Types of Moisturizers(internal use)  Vitamin E vaginal suppositories- Whole foods, Amazon Moist Again Coconut oil- can break down condoms Julva- (Do no use if on Tamoxifen) amazon Yes moisturizer- amazon NeuEve Silk , NeuEve Silver for menopausal or over 65 (if have severe vaginal atrophy or cancer treatments use NeuEve Silk for  1 month than move to [employer])- [employer], [place].com Olive and Bee intimate cream- www.oliveandbee.com.au Mae vaginal moisturizer- Amazon Aloe    Creams to use externally on the Vulva area Marathon Oil (good for for cancer patients that had radiation to the area)- [place] or [employer].https://garcia-[email or web address]/ V-magic cream - amazon Julva-amazon Vital "  V Wild Yam salve ( help moisturize and help with thinning vulvar area, does have Beeswax MoodMaid Botanical Pro-Meno Wild Yam Cream-  Amazon Desert Harvest Gele Cleo by Zane Herald labial moisturizer (Amazon,  Coconut or olive oil aloe   Things to avoid in the vaginal area Do not use things to irritate the vulvar area No lotions just specialized creams for the vulva area- Neogyn, V-magic, No soaps; can use Aveeno or Calendula cleanser if needed. Must be gentle No deodorants No douches Good to sleep without underwear to let the vaginal area to air out No scrubbing: spread the lips to let warm water rinse over labias and pat dry   [facility] [address] [place], [place] [address] Phone # [phone] Fax [phone]

## 2021-08-21 ENCOUNTER — Other Ambulatory Visit: Payer: Self-pay

## 2021-08-21 MED ORDER — MEDROXYPROGESTERONE ACETATE 10 MG PO TABS: 10.0000 mg | ORAL_TABLET | Freq: Every day | ORAL | 0 refills | Status: DC

## 2021-08-24 MED ORDER — MEDROXYPROGESTERONE ACETATE 10 MG PO TABS: 10.0000 mg | ORAL_TABLET | Freq: Every day | ORAL | 0 refills | Status: DC

## 2021-08-26 ENCOUNTER — Ambulatory Visit: Payer: Medicaid Other | Admitting: Physical Therapy

## 2021-08-26 ENCOUNTER — Other Ambulatory Visit: Payer: Self-pay

## 2021-08-26 DIAGNOSIS — R252 Cramp and spasm: Secondary | ICD-10-CM

## 2021-08-26 DIAGNOSIS — M6281 Muscle weakness (generalized): Secondary | ICD-10-CM

## 2021-08-26 DIAGNOSIS — R279 Unspecified lack of coordination: Secondary | ICD-10-CM

## 2021-08-26 NOTE — Therapy (Signed)
[facility] [address] [place], [place], [address] Phone: [phone]   Fax:  [phone]  Physical Therapy Treatment  Patient Details  Name: Miranda Caldwell MRN: [id number] Date of Birth: 10-Sep-1986 Referring Provider (PT): Candelaria Celeste, DO   Encounter Date: 08/26/2021   PT End of Session - 08/26/21 1145     Visit Number 2    Date for PT Re-Evaluation 12/20/21    Authorization Type [employer] MCD    Authorization Time Period 27 visits combined PT/OT/ST    Authorization - Visit Number 2    Authorization - Number of Visits 27    PT Start Time 1102    PT Stop Time 1143    PT Time Calculation (min) 41 min    Activity Tolerance Patient tolerated treatment well;Patient limited by pain    Behavior During Therapy [facility] for tasks assessed/performed             No past medical history on file.  No past surgical history on file.  There were no vitals filed for this visit.   Subjective Assessment - 08/26/21 1104     Subjective Pt reports pain continues and hasn't noticed a change and is currently still bleeding post IUD placement.    Patient is accompained by: Interpreter    Limitations Standing    How long can you sit comfortably? 30 mins    How long can you stand comfortably? 1 hour    How long can you walk comfortably? 10 mins    Patient Stated Goals to have less pain    Currently in Pain? Yes    Pain Score 7     Pain Location Pelvis    Pain Orientation Lower    Pain Descriptors / Indicators Cramping    Pain Type Post Delivery (post partum)                               [facility] Adult PT Treatment/Exercise - 08/26/21 0001       Exercises   Exercises Lumbar;Knee/Hip      Lumbar Exercises: Stretches   Pelvic Tilt 2 reps;10 reps    Pelvic Tilt Limitations anterior/posterior and Rt/Lt on ball      Lumbar Exercises: Standing   Other Standing Lumbar Exercises palloffs red band x10  each'rotation palloff red band x10 each      Lumbar Exercises: Seated   Hip Flexion on Ball Strengthening;Both;20 reps    Hip Flexion on Ball Limitations with alternating shoulder flexion x20 each      Lumbar Exercises: Supine   Other Supine Lumbar Exercises TA activation 2x10 with intermittent VC needed throughout and tactile cues but moderately consistent    Other Supine Lumbar Exercises same side knee/arm press 2x10 each side      Manual Therapy   Manual Therapy Soft tissue mobilization;Taping    Manual therapy comments soft tissue and fasical release at lower abdomen to decrease pain and improve moblity, restrictions noted above public bone and in lateral directions, improved post treatment    Kinesiotex Facilitate Muscle   pt denied knwon tape allergies and and educated on how to remove and to remove if there were any skin allergies     Kinesiotix   Facilitate Muscle  at lower abdomen for improved support at TA and promote improve activation and support at pelvis to decrease pain  PT Education - 08/26/21 1143     Education Details Pt educated on technique for exercises throughout full session and taping removal and how to remove when needed and to remove tape with any skin reactions.    Person(s) Educated Patient    Methods Explanation;Demonstration;Tactile cues;Verbal cues    Comprehension Verbalized understanding;Returned demonstration              PT Short Term Goals - 08/20/21 1309       PT SHORT TERM GOAL #1   Title Patient will be independent with HEP for continued progression at home.    Time 4    Period Weeks    Status New    Target Date 09/17/21      PT SHORT TERM GOAL #2   Title pt to report no more than 1 urinary leak per week with coughing or sneezing to decrease leakage.    Time 4    Period Weeks    Status New    Target Date 09/17/21      PT SHORT TERM GOAL #3   Title pt to report no more than 4/10 pain at lower  abdomen/upper pelvis with activity to improve QOL.    Time 4    Period Weeks    Status New    Target Date 09/17/21      PT SHORT TERM GOAL #4   Title pt to demonstrate improved coordination of core/pelvic floor contract/relax and breathing pattern at least 50% of the time for decreased strain at pelvic floor    Time 4    Period Weeks    Status New    Target Date 09/17/21               PT Long Term Goals - 08/20/21 1312       PT LONG TERM GOAL #1   Title Patient will be independent with advanced HEP for long term management of symptoms post D/C.    Time 3    Period Months    Status New    Target Date 11/19/21      PT LONG TERM GOAL #2   Title pt to report no more than 1 urinary leak per week with coughing or sneezing to decrease leakage.    Time 3    Period Months    Status New    Target Date 11/19/21      PT LONG TERM GOAL #3   Title pt to report no more than 2/10 pain at lower abdomen/upper pelvis with activity to improve QOL.    Time 3    Period Months    Status New    Target Date 11/19/21      PT LONG TERM GOAL #4   Title pt to demonstrate improved coordination of core/pelvic floor contract/relax and breathing pattern at least 75% of the time for decreased strain at pelvic floor    Time 3    Status New    Target Date 11/19/21      PT LONG TERM GOAL #5   Title pt to demonstrate at least 4/5 pelvic floor strength for improved urinary incontinence and decreased pain.    Time 3    Period Months    Status New    Target Date 11/19/21                   Plan - 08/26/21 1145     Clinical Impression Statement Pt presenting to clinic with continued pain and unable  to have intercourse due to this with pain constant in nature and still having bleeding from IUD placement. Pt reported she has told MD about this and they have given her something and is being followed. Pt session focused on core strengthening and activation with proper breathing mechanics, does  continue to need tactile and VC for TA activations and breathing technique throughout session. Taping and manual work completed this session as well for decreased pain and to promote improved core activation with mobility. Pt verbalized understanding about taping, denied allergies, removal and to remove with any skin irritation. Interpreter present and utilize during session. PT tolerated session well. Pt would benefit from continued PT to address deficits found during eval.    Personal Factors and Comorbidities Fitness;Comorbidity 1;Time since onset of injury/illness/exacerbation    Comorbidities postpartum    Examination-Activity Limitations Continence    Examination-Participation Restrictions Interpersonal Relationship;Community Activity    Stability/Clinical Decision Making Stable/Uncomplicated    Clinical Decision Making Low    Rehab Potential Excellent    PT Frequency 1x / week    PT Duration 8 weeks    PT Treatment/Interventions ADLs/Self Care Home Management;Cryotherapy;Electrical Stimulation;Iontophoresis 4mg /ml Dexamethasone;Moist Heat;Ultrasound;Functional mobility training;Therapeutic activities;Therapeutic exercise;Neuromuscular re-education;Patient/family education;Manual techniques;Dry needling;Taping;Spinal Manipulations;Joint Manipulations;Aquatic Therapy;Energy conservation;Passive range of motion;Scar mobilization    PT Next Visit Plan core and hip stretching/strengthening and spine stretching.    PT Home Exercise Plan Access Code: [email or web address]    Consulted and Agree with Plan of Care Patient             Patient will benefit from skilled therapeutic intervention in order to improve the following deficits and impairments:  Decreased activity tolerance, Difficulty walking, Increased muscle spasms, Increased fascial restricitons, Pain, Postural dysfunction, Decreased strength, Decreased mobility, Improper body mechanics, Impaired flexibility  Visit Diagnosis: Lack of  coordination  Muscle weakness (generalized)  Cramp and spasm     Problem List Patient Active Problem List   Diagnosis Date Noted   Language barrier 09/02/2020    11/02/2020, PT, DPT 08/26/2210:49 AM   [facility] [address] [place], [name], [place] Phone: [phone]   Fax:  [phone]  Name: Sneha Willig MRN: [name] Date of Birth: 1986/03/27

## 2021-09-09 ENCOUNTER — Other Ambulatory Visit: Payer: Self-pay

## 2021-09-09 ENCOUNTER — Ambulatory Visit: Payer: Medicaid Other | Attending: Family Medicine | Admitting: Physical Therapy

## 2021-09-09 ENCOUNTER — Encounter: Payer: Self-pay | Admitting: Physical Therapy

## 2021-09-09 DIAGNOSIS — R279 Unspecified lack of coordination: Secondary | ICD-10-CM | POA: Diagnosis present

## 2021-09-09 DIAGNOSIS — R252 Cramp and spasm: Secondary | ICD-10-CM | POA: Diagnosis not present

## 2021-09-09 DIAGNOSIS — M6281 Muscle weakness (generalized): Secondary | ICD-10-CM | POA: Diagnosis present

## 2021-09-09 NOTE — Therapy (Signed)
[facility] [address] [place], [place], [address] Phone: [phone]   Fax:  [phone]  Physical Therapy Treatment  Patient Details  Name: Miranda Caldwell MRN: [id number] Date of Birth: Apr 02, 1986 Referring Provider (PT): Candelaria Celeste, DO   Encounter Date: 09/09/2021   PT End of Session - 09/09/21 1109     Visit Number 3    Date for PT Re-Evaluation 12/20/21    Authorization Type [employer] MCD    Authorization Time Period 27 visits combined PT/OT/ST    Authorization - Visit Number 3    Authorization - Number of Visits 27    PT Start Time 1018    PT Stop Time 1100    PT Time Calculation (min) 42 min    Activity Tolerance Patient tolerated treatment well;Patient limited by pain    Behavior During Therapy [facility] for tasks assessed/performed             History reviewed. No pertinent past medical history.  History reviewed. No pertinent surgical history.  There were no vitals filed for this visit.   Subjective Assessment - 09/09/21 1020     Subjective Pt reports pain continues to have pelvic pain worse with activity, back pain has resolved. Does still have bleeding constantly and doctor aware and treating.    Patient is accompained by: Interpreter    Limitations Standing    How long can you sit comfortably? 30 mins    How long can you stand comfortably? 1 hour    How long can you walk comfortably? 10 mins    Patient Stated Goals to have less pain    Currently in Pain? Yes    Pain Score 7     Pain Location Pelvis    Pain Orientation Lower;Medial    Pain Descriptors / Indicators Cramping    Pain Type Post Delivery (post partum)    Pain Onset More than a month ago    Pain Frequency Intermittent    Aggravating Factors  activity                               [facility] Adult PT Treatment/Exercise - 09/09/21 0001       Exercises   Exercises Lumbar;Knee/Hip      Lumbar Exercises:  Stretches   Pelvic Tilt 2 reps;10 reps    Pelvic Tilt Limitations anterior/posterior and Rt/Lt on ball cues with slightly increased pain reported with anterior tilt stretching lower abdomen      Lumbar Exercises: Standing   Other Standing Lumbar Exercises palloffs green band x10 each rotation palloff green band x10 each      Lumbar Exercises: Supine   Other Supine Lumbar Exercises TA activation 2x10 with intermittent VC needed throughout and tactile cues but moderately consistent. 2x10 with blowing into "balloon" technique with greatly improved activation consistently at TA.    Other Supine Lumbar Exercises ball squeeze in hooklying with TA activation and blowing into balloon technique.      Knee/Hip Exercises: Standing   Hip Abduction Stengthening;Both;10 reps    Abduction Limitations gren band    Functional Squat 2 sets;10 reps    Functional Squat Limitations body weight with breathing mechanics      Manual Therapy   Manual Therapy Taping    Kinesiotex Facilitate Muscle   pt denied known tape allergies and and educated on how to remove and to remove if there were any  skin allergies     Kinesiotix   Facilitate Muscle  at lower abdomen for improved support at TA and promote improve activation and support at pelvis to decrease pain                     PT Education - 09/09/21 1109     Education Details Pt educated on TA activations with all activities, breathing mechanics and safety with taping. Interpreter present for full session.    Person(s) Educated Patient    Methods Explanation;Tactile cues;Demonstration;Verbal cues    Comprehension Verbalized understanding;Returned demonstration              PT Short Term Goals - 08/20/21 1309       PT SHORT TERM GOAL #1   Title Patient will be independent with HEP for continued progression at home.    Time 4    Period Weeks    Status New    Target Date 09/17/21      PT SHORT TERM GOAL #2   Title pt to report no more  than 1 urinary leak per week with coughing or sneezing to decrease leakage.    Time 4    Period Weeks    Status New    Target Date 09/17/21      PT SHORT TERM GOAL #3   Title pt to report no more than 4/10 pain at lower abdomen/upper pelvis with activity to improve QOL.    Time 4    Period Weeks    Status New    Target Date 09/17/21      PT SHORT TERM GOAL #4   Title pt to demonstrate improved coordination of core/pelvic floor contract/relax and breathing pattern at least 50% of the time for decreased strain at pelvic floor    Time 4    Period Weeks    Status New    Target Date 09/17/21               PT Long Term Goals - 08/20/21 1312       PT LONG TERM GOAL #1   Title Patient will be independent with advanced HEP for long term management of symptoms post D/C.    Time 3    Period Months    Status New    Target Date 11/19/21      PT LONG TERM GOAL #2   Title pt to report no more than 1 urinary leak per week with coughing or sneezing to decrease leakage.    Time 3    Period Months    Status New    Target Date 11/19/21      PT LONG TERM GOAL #3   Title pt to report no more than 2/10 pain at lower abdomen/upper pelvis with activity to improve QOL.    Time 3    Period Months    Status New    Target Date 11/19/21      PT LONG TERM GOAL #4   Title pt to demonstrate improved coordination of core/pelvic floor contract/relax and breathing pattern at least 75% of the time for decreased strain at pelvic floor    Time 3    Status New    Target Date 11/19/21      PT LONG TERM GOAL #5   Title pt to demonstrate at least 4/5 pelvic floor strength for improved urinary incontinence and decreased pain.    Time 3    Period Months    Status New  Target Date 11/19/21                   Plan - 09/09/21 1110     Clinical Impression Statement Pt presenting to clinic with contined pain at lower abdomen/pelvis, unable to have intercourse due to pain and now continued  bleeding since IUD placement. MD aware and treating, pt educated to let MD know if new treatment is not helping. Pt reports she is no longer having back pain and now pelvic pain is intermittent compared to constant in nature. Pt session focused on core and hip strengthening and proper activation of TA with activity with breathing mechanics for all exercises. Pt required extra time and and moderate cues for activating TA in hooklying, once cued for "balloon" technique with blowing for TA activation this greatly improved and she was consistently able to contract. Pt session also progressed to standing hip strengthening and introduction of body weight squats. Pt tolerated well with rest breaks. Taping completed this session as well for decreased pain and to promote improved core activation with mobility. Pt verbalized understanding about taping, denied allergies, removal and to remove with any skin irritation. Interpreter present and utilize during session. PT tolerated session well. Pt would benefit from continued PT to address deficits found during eval.    Personal Factors and Comorbidities Fitness;Comorbidity 1;Time since onset of injury/illness/exacerbation    Comorbidities postpartum    Examination-Activity Limitations Continence    Examination-Participation Restrictions Interpersonal Relationship;Community Activity    Stability/Clinical Decision Making Stable/Uncomplicated    Clinical Decision Making Low    Rehab Potential Excellent    PT Frequency 1x / week    PT Duration 8 weeks    PT Treatment/Interventions ADLs/Self Care Home Management;Cryotherapy;Electrical Stimulation;Iontophoresis 4mg /ml Dexamethasone;Moist Heat;Ultrasound;Functional mobility training;Therapeutic activities;Therapeutic exercise;Neuromuscular re-education;Patient/family education;Manual techniques;Dry needling;Taping;Spinal Manipulations;Joint Manipulations;Aquatic Therapy;Energy conservation;Passive range of motion;Scar  mobilization    PT Next Visit Plan core and hip stretching/strengthening and spine stretching.    PT Home Exercise Plan Access Code: [email or web address]    Consulted and Agree with Plan of Care Patient             Patient will benefit from skilled therapeutic intervention in order to improve the following deficits and impairments:  Decreased activity tolerance, Difficulty walking, Increased muscle spasms, Increased fascial restricitons, Pain, Postural dysfunction, Decreased strength, Decreased mobility, Improper body mechanics, Impaired flexibility  Visit Diagnosis: Cramp and spasm  Lack of coordination  Muscle weakness (generalized)     Problem List Patient Active Problem List   Diagnosis Date Noted   Language barrier 09/02/2020    11/02/2020, PT, DPT 09/09/2210:14 AM   [facility] [address] [place], [place], [place] Phone: [phone]   Fax:  [phone]  Name: Miranda Caldwell MRN: [name] Date of Birth: June 08, 1986

## 2021-09-13 ENCOUNTER — Other Ambulatory Visit: Payer: Self-pay | Admitting: Family Medicine

## 2021-09-16 ENCOUNTER — Ambulatory Visit: Payer: Medicaid Other | Admitting: Physical Therapy

## 2021-09-23 ENCOUNTER — Encounter: Payer: Self-pay | Admitting: Physical Therapy

## 2021-09-23 ENCOUNTER — Other Ambulatory Visit: Payer: Self-pay

## 2021-09-23 ENCOUNTER — Ambulatory Visit: Payer: Medicaid Other | Admitting: Physical Therapy

## 2021-09-23 DIAGNOSIS — M6281 Muscle weakness (generalized): Secondary | ICD-10-CM

## 2021-09-23 DIAGNOSIS — R252 Cramp and spasm: Secondary | ICD-10-CM | POA: Diagnosis not present

## 2021-09-23 DIAGNOSIS — R279 Unspecified lack of coordination: Secondary | ICD-10-CM

## 2021-09-23 NOTE — Therapy (Signed)
[facility] [address] [place], [place], [address] Phone: [phone]   Fax:  [phone]  Physical Therapy Treatment  Patient Details  Name: Miranda Caldwell MRN: [id number] Date of Birth: 1986/09/18 Referring Provider (PT): Candelaria Celeste, DO   Encounter Date: 09/23/2021   PT End of Session - 09/23/21 1139     Visit Number 4    Date for PT Re-Evaluation 12/20/21    Authorization Type UHC MCD    Authorization Time Period 27 visits combined PT/OT/ST    Authorization - Visit Number 4    Authorization - Number of Visits 27    PT Start Time 1100    PT Stop Time 1139    PT Time Calculation (min) 39 min    Activity Tolerance Patient tolerated treatment well;Patient limited by pain    Behavior During Therapy [facility] for tasks assessed/performed             History reviewed. No pertinent past medical history.  History reviewed. No pertinent surgical history.  There were no vitals filed for this visit.   Subjective Assessment - 09/23/21 1059     Subjective Pt reports vaginal bleeding has gotten better but is still bleeding/spotting now. Pain has gotten better at pelvis and now no more than 5/10. Pt reports no more painful intercourse.    Patient is accompained by: Interpreter    Limitations Standing    How long can you sit comfortably? 1 hour    How long can you stand comfortably? no limits    How long can you walk comfortably? no limits    Patient Stated Goals to have less pain    Currently in Pain? Yes    Pain Score 5     Pain Location Pelvis    Pain Orientation Mid;Lower    Pain Descriptors / Indicators Cramping    Pain Type Post Delivery (post partum)                       No emotional/communication barriers or cognitive limitation. Patient is motivated to learn. Patient understands and agrees with treatment goals and plan. PT explains patient will be examined in standing, sitting, and lying down  to see how their muscles and joints work. When they are ready, they will be asked to remove their underwear so PT can examine their perineum. The patient is also given the option of providing their own chaperone as one is not provided in our facility. The patient also has the right and is explained the right to defer or refuse any part of the evaluation or treatment including the internal exam. With the patient's consent, PT will use one gloved finger to gently assess the muscles of the pelvic floor, seeing how well it contracts and relaxes and if there is muscle symmetry. After, the patient will get dressed and PT and patient will discuss exam findings and plan of care. PT and patient discuss plan of care, schedule, attendance policy and HEP activities.       Pelvic Floor Special Questions - 09/23/21 0001     Urinary Leakage No    How often not at all now    Falling out feeling (prolapse) No    Activities that cause feeling of prolapse pt reports this has resolved and no longer has any symptoms of ths    Pelvic Floor Internal Exam patient identified and patient confirms consent for PT to perform internal soft tissue work  and muscle strength and integrity assessment    Exam Type Vaginal    Sensation WFL    Palpation no TTP    Strength good squeeze, good lift, able to hold agaisnt strong resistance   but with quick release to initiate contractions correctly   Strength # of reps 8    Strength # of seconds 8    Tone WFL               [facility] Adult PT Treatment/Exercise - 09/23/21 0001       Neuro Re-ed    Neuro Re-ed Details  pelvic floor internal treatment with 3x10 contract/relaxation with quick release intermittently used to isolate contractions; 3x5x5-8s holds      Exercises   Exercises Lumbar;Knee/Hip      Lumbar Exercises: Standing   Functional Squats 20 reps    Functional Squats Limitations 2x10 body weight    Other Standing Lumbar Exercises palloffs green band x10 each  rotation palloff green band x10 each; standing with hands supported at elevated mat table for modifed quad progression of TA activations 2x10    Other Standing Lumbar Exercises diaphragmatic breathing in standing with tactile cues to complete and VC for breathing pattern 2x10      Lumbar Exercises: Supine   Other Supine Lumbar Exercises bil shoulder extension green band x10 with breathing mechanics      Knee/Hip Exercises: Standing   Forward Lunges Both;10 reps    Forward Lunges Limitations with green band in front held at shoulder height                     PT Education - 09/23/21 1138     Education Details Pt educated on progressions with TA activations and breathing mechanics and to continue kegels at home intermittently in day, interpeter present throughout.    Person(s) Educated Patient    Methods Explanation;Demonstration;Tactile cues;Verbal cues    Comprehension Verbalized understanding;Returned demonstration              PT Short Term Goals - 08/20/21 1309       PT SHORT TERM GOAL #1   Title Patient will be independent with HEP for continued progression at home.    Time 4    Period Weeks    Status New    Target Date 09/17/21      PT SHORT TERM GOAL #2   Title pt to report no more than 1 urinary leak per week with coughing or sneezing to decrease leakage.    Time 4    Period Weeks    Status New    Target Date 09/17/21      PT SHORT TERM GOAL #3   Title pt to report no more than 4/10 pain at lower abdomen/upper pelvis with activity to improve QOL.    Time 4    Period Weeks    Status New    Target Date 09/17/21      PT SHORT TERM GOAL #4   Title pt to demonstrate improved coordination of core/pelvic floor contract/relax and breathing pattern at least 50% of the time for decreased strain at pelvic floor    Time 4    Period Weeks    Status New    Target Date 09/17/21               PT Long Term Goals - 08/20/21 1312       PT LONG TERM  GOAL #1   Title Patient will  be independent with advanced HEP for long term management of symptoms post D/C.    Time 3    Period Months    Status New    Target Date 11/19/21      PT LONG TERM GOAL #2   Title pt to report no more than 1 urinary leak per week with coughing or sneezing to decrease leakage.    Time 3    Period Months    Status New    Target Date 11/19/21      PT LONG TERM GOAL #3   Title pt to report no more than 2/10 pain at lower abdomen/upper pelvis with activity to improve QOL.    Time 3    Period Months    Status New    Target Date 11/19/21      PT LONG TERM GOAL #4   Title pt to demonstrate improved coordination of core/pelvic floor contract/relax and breathing pattern at least 75% of the time for decreased strain at pelvic floor    Time 3    Status New    Target Date 11/19/21      PT LONG TERM GOAL #5   Title pt to demonstrate at least 4/5 pelvic floor strength for improved urinary incontinence and decreased pain.    Time 3    Period Months    Status New    Target Date 11/19/21                   Plan - 09/23/21 1140     Clinical Impression Statement Pt presenting to clinic reporting greatly improved pain overall, no longer having leakage, pain with intercourse, and vaginal bleeding almost gone now just intermittent spotting. Pt reports she only has pelvic pain sometimes and at worse a 5/10. Pt tolerated session well with intermittent VC for breathing technique and core activation. Pt demonstrated improved ability to activate core with less cues, "balloon" technique helped and able to connect breathing mechanics with core activation in supine, quad, and modified quad progression in standing, noted difficulty in standing but able with cues. Pt also consented to internal pelvic assessment/treatment this date as well with interpreter present to confirm, initially pt demonstrated glute and core contractions with command to contract pelvic floor, however  with quick release technique and cues pt able to complete consistent 4/5 strength contractions, and demonstrated improved strength, endurance, and coordination. Pt session also included core/hip strengthening with breathing mechanics to decrease strain at pelvis. Pt denied pelvic pain throughout session. Pt requests for next session to potentially be her last if she continues to have this improvement and no pain.Pt would benefit from additional session to decrease pain at pelvis and improve core strength, educate pt on lifting mechanics and breathing mechanics to decrease strain at PF for activities and mobility at home and in community.    Personal Factors and Comorbidities Fitness;Comorbidity 1;Time since onset of injury/illness/exacerbation    Comorbidities postpartum    Examination-Activity Limitations Continence    Examination-Participation Restrictions Interpersonal Relationship;Community Activity    Stability/Clinical Decision Making Stable/Uncomplicated    Clinical Decision Making Low    Rehab Potential Excellent    PT Frequency 1x / week    PT Duration 8 weeks    PT Treatment/Interventions ADLs/Self Care Home Management;Cryotherapy;Electrical Stimulation;Iontophoresis 4mg /ml Dexamethasone;Moist Heat;Ultrasound;Functional mobility training;Therapeutic activities;Therapeutic exercise;Neuromuscular re-education;Patient/family education;Manual techniques;Dry needling;Taping;Spinal Manipulations;Joint Manipulations;Aquatic Therapy;Energy conservation;Passive range of motion;Scar mobilization    PT Next Visit Plan core and hip stretching/strengthening and spine stretching.  PT Home Exercise Plan Access Code: [email or web address]    Consulted and Agree with Plan of Care Patient             Patient will benefit from skilled therapeutic intervention in order to improve the following deficits and impairments:  Decreased activity tolerance, Difficulty walking, Increased muscle spasms, Increased fascial  restricitons, Pain, Postural dysfunction, Decreased strength, Decreased mobility, Improper body mechanics, Impaired flexibility  Visit Diagnosis: Lack of coordination  Muscle weakness (generalized)     Problem List Patient Active Problem List   Diagnosis Date Noted   Language barrier 09/02/2020   Otelia Sergeant, PT, DPT 09/23/2210:47 AM   [facility] [address] [place], [place], [address] Phone: [phone]   Fax:  [phone]  Name: Miranda Caldwell MRN: [id number] Date of Birth: 07-May-1986

## 2021-09-30 ENCOUNTER — Ambulatory Visit: Payer: Medicaid Other | Attending: Family Medicine | Admitting: Physical Therapy

## 2021-09-30 ENCOUNTER — Other Ambulatory Visit: Payer: Self-pay

## 2021-09-30 DIAGNOSIS — M6281 Muscle weakness (generalized): Secondary | ICD-10-CM | POA: Diagnosis present

## 2021-09-30 DIAGNOSIS — R293 Abnormal posture: Secondary | ICD-10-CM | POA: Diagnosis present

## 2021-09-30 DIAGNOSIS — R252 Cramp and spasm: Secondary | ICD-10-CM | POA: Diagnosis present

## 2021-09-30 NOTE — Progress Notes (Signed)
Chart reviewed - agree with CMA/RN documentation.  ° °

## 2021-09-30 NOTE — Therapy (Signed)
[facility] [place], [place], [address] Phone: [phone]   Fax:  [phone]  Physical Therapy Treatment  Patient Details  Name: Miranda Caldwell MRN: [id number] Date of Birth: 1986/06/26 Referring Provider (PT): Loma Boston, DO   Encounter Date: 09/30/2021   PT End of Session - 09/30/21 1103     Visit Number 5    Date for PT Re-Evaluation 12/20/21    Authorization Type UHC MCD    Authorization Time Period 27 visits combined PT/OT/ST    Authorization - Visit Number 5    Authorization - Number of Visits 27    PT Start Time 0102    PT Stop Time 1055    PT Time Calculation (min) 40 min    Activity Tolerance Patient tolerated treatment well;Patient limited by pain    Behavior During Therapy [facility] for tasks assessed/performed             No past medical history on file.  No past surgical history on file.  There were no vitals filed for this visit.   Subjective Assessment - 09/30/21 1018     Subjective Pt reports pelvic pain has greatly improved, no pain with sex, and mild spotting remains but improved.    Patient is accompained by: Interpreter    Limitations Standing    How long can you sit comfortably? 2 hours    How long can you stand comfortably? no limits    How long can you walk comfortably? no limits    Patient Stated Goals to have less pain    Currently in Pain? No/denies                               [facility] Adult PT Treatment/Exercise - 09/30/21 0001       Self-Care   Self-Care Other Self-Care Comments    Other Self-Care Comments  Pt educated on continued mobility post DC as pt had questions about this and increasing working out on her own. Pt given updated HEP and went over, denied additional questions.      Exercises   Exercises Lumbar;Knee/Hip      Lumbar Exercises: Standing   Functional Squats 20 reps    Functional Squats Limitations 2x10 body weight     Other Standing Lumbar Exercises 2x10 TA activations in standing no additional cues needed    Other Standing Lumbar Exercises x10 palloffs blue band; x10 rotation palloffs blue band      Lumbar Exercises: Supine   Clam 20 reps    Clam Limitations 2x10 black loop    Bridge 10 reps    Other Supine Lumbar Exercises TA activation 2x10 no additional cues this date    Other Supine Lumbar Exercises heel touches with mini crunch x10; mini crunch with bil arm press into bil knees x10; alt opp arm/knee press x10      Lumbar Exercises: Quadruped   Other Quadruped Lumbar Exercises knee hover 2x10                     PT Education - 09/30/21 1101     Education Details Pt educated on proper technique of all exercises and updated HEP.    Person(s) Educated Patient    Methods Explanation;Demonstration;Tactile cues;Verbal cues    Comprehension Verbalized understanding;Returned demonstration              PT Short Term Goals -  09/30/21 1109       PT SHORT TERM GOAL #1   Title Patient will be independent with HEP for continued progression at home.    Time 4    Period Weeks    Status Achieved    Target Date 09/17/21      PT SHORT TERM GOAL #2   Title pt to report no more than 1 urinary leak per week with coughing or sneezing to decrease leakage.    Time 4    Period Weeks    Status Achieved    Target Date 09/17/21      PT SHORT TERM GOAL #3   Title pt to report no more than 4/10 pain at lower abdomen/upper pelvis with activity to improve QOL.    Time 4    Period Weeks    Status Achieved    Target Date 09/17/21      PT SHORT TERM GOAL #4   Title pt to demonstrate improved coordination of core/pelvic floor contract/relax and breathing pattern at least 50% of the time for decreased strain at pelvic floor    Time 4    Period Weeks    Status Achieved    Target Date 09/17/21               PT Long Term Goals - 09/30/21 1110       PT LONG TERM GOAL #1   Title Patient  will be independent with advanced HEP for long term management of symptoms post D/C.    Time 3    Period Months    Status Achieved      PT LONG TERM GOAL #2   Title pt to report no more than 1 urinary leak per week with coughing or sneezing to decrease leakage.    Time 3    Period Months    Status Achieved      PT LONG TERM GOAL #3   Title pt to report no more than 2/10 pain at lower abdomen/upper pelvis with activity to improve QOL.    Time 3    Period Months    Status Achieved      PT LONG TERM GOAL #4   Title pt to demonstrate improved coordination of core/pelvic floor contract/relax and breathing pattern at least 75% of the time for decreased strain at pelvic floor    Time 3    Status Achieved      PT LONG TERM GOAL #5   Title pt to demonstrate at least 4/5 pelvic floor strength for improved urinary incontinence and decreased pain.    Time 3    Period Months    Status Achieved                   Plan - 09/30/21 1104     Clinical Impression Statement Pt presents to clinic reporting she has had continued improvement with pain overall, no longer having leakage, pain with intercourse, no pelvic pain, and only having mild spotting with brown tinted dischare. Pt reports she hasn't had pelvic pain at all since last session. Pt had multiple questions about continuing mobility post DC and HEP updated, PT went over this and pt denied additional questions and given resources on home workouts or gym workouts and to ask gym attendant any questions as needed if going to gym. Pt agreed. Pt session focused on core strengthening and activation based exercises with pt demonstrating greatly improved ability to activate TA without additional cues in supine, hooklying,  quad, and standing and with exercises. Pt denied pain throughout session and additional needs and concerns for PT post this session, no questions. This will serve as pt's DC from PT as she has denied additional needs and  demonstrated meeting all goals. Pt very pleased with progress, interpreter used throughout.    Personal Factors and Comorbidities Fitness;Comorbidity 1;Time since onset of injury/illness/exacerbation    Comorbidities postpartum    Examination-Activity Limitations Continence    Examination-Participation Restrictions Interpersonal Relationship;Community Activity    Stability/Clinical Decision Making Stable/Uncomplicated    Rehab Potential Excellent    PT Frequency 1x / week    PT Duration 8 weeks    PT Treatment/Interventions ADLs/Self Care Home Management;Cryotherapy;Electrical Stimulation;Iontophoresis 46m/ml Dexamethasone;Moist Heat;Ultrasound;Functional mobility training;Therapeutic activities;Therapeutic exercise;Neuromuscular re-education;Patient/family education;Manual techniques;Dry needling;Taping;Spinal Manipulations;Joint Manipulations;Aquatic Therapy;Energy conservation;Passive range of motion;Scar mobilization    PT Home Exercise Plan Access Code: [email or web address]   Consulted and Agree with Plan of Care Patient             Patient will benefit from skilled therapeutic intervention in order to improve the following deficits and impairments:  Decreased activity tolerance, Difficulty walking, Increased muscle spasms, Increased fascial restricitons, Pain, Postural dysfunction, Decreased strength, Decreased mobility, Improper body mechanics, Impaired flexibility  Visit Diagnosis: Cramp and spasm  Abnormal posture  Muscle weakness (generalized)     Problem List Patient Active Problem List   Diagnosis Date Noted   Language barrier 09/02/2020    PHYSICAL THERAPY DISCHARGE SUMMARY  Visits from Start of Care: 5  Current functional level related to goals / functional outcomes: All goals met   Remaining deficits: All goals met and pt denied additional needs from PT at this time, no pelvic pain, pain with intercourse, no leakage, and demonstrated improved functional strength per  goals.    Education / Equipment: HEP   Patient agrees to discharge. Patient goals were met. Patient is being discharged due to meeting the stated rehab goals. Thank you for the  referral.   HStacy Gardner PT, DPT 09/30/2210:12 AM   [email or web address] [address]Phone: [phone]  Fax:  [phone] Name: SMarliss ButtacavoliMRN: [id number] of Birth: [birth year]

## 2021-10-01 ENCOUNTER — Other Ambulatory Visit: Payer: Self-pay | Admitting: Family Medicine

## 2021-10-07 ENCOUNTER — Encounter: Payer: Medicaid Other | Admitting: Physical Therapy

## 2021-10-14 ENCOUNTER — Encounter: Payer: Medicaid Other | Admitting: Physical Therapy

## 2021-10-16 IMAGING — US US MFM OB FOLLOW-UP
1 series · 14 of 28 positions shown · non-contrast
Comparison: none

[Series 1: us mfm ob follow-up · 48 acquisitions, 14 frames shown]
[im 2/48]
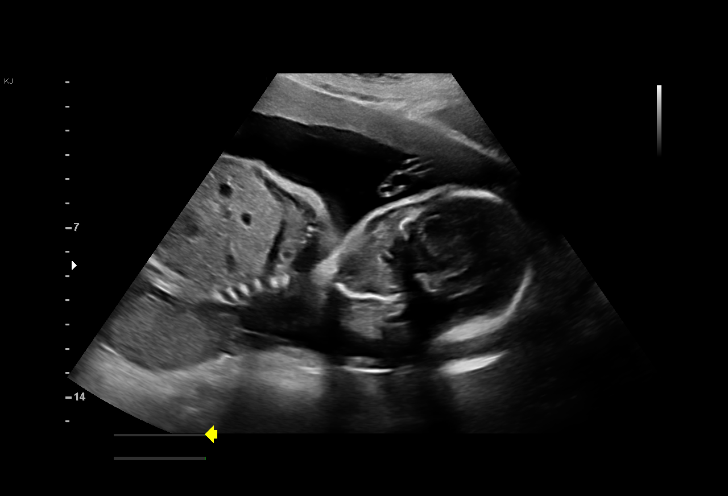
[im 6/48]
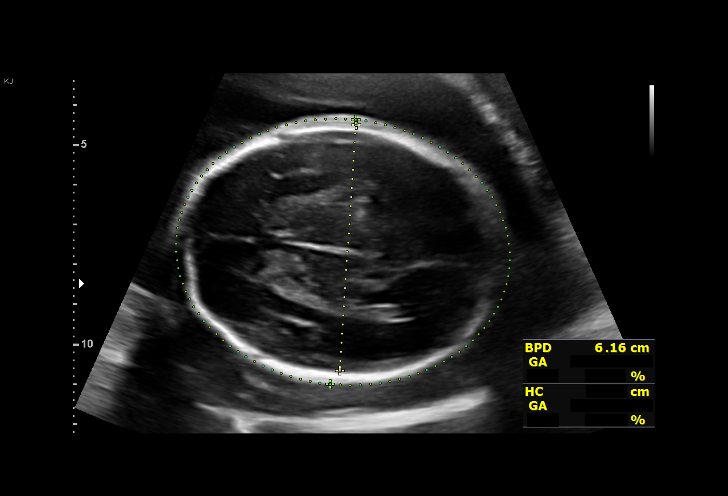
[im 9/48]
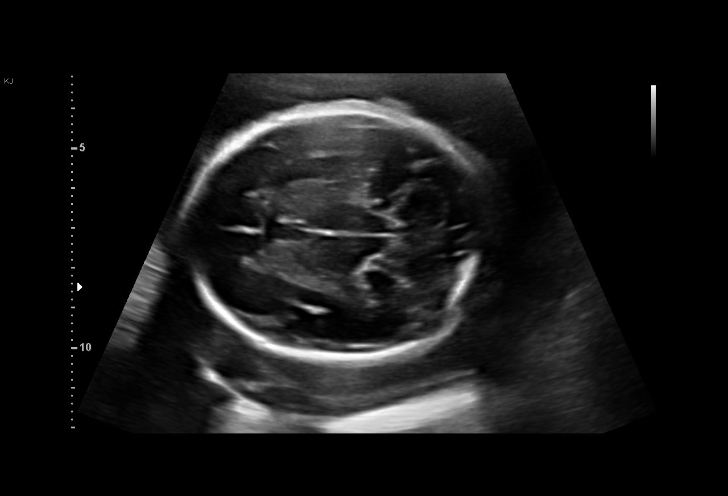
[im 13/48]
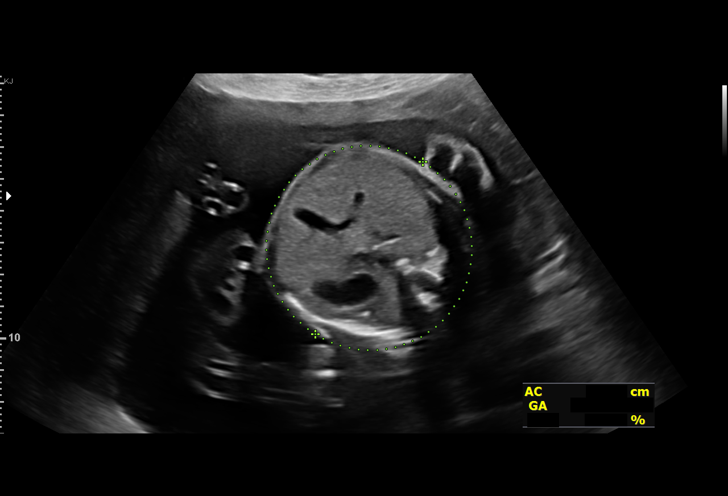
[im 16/48]
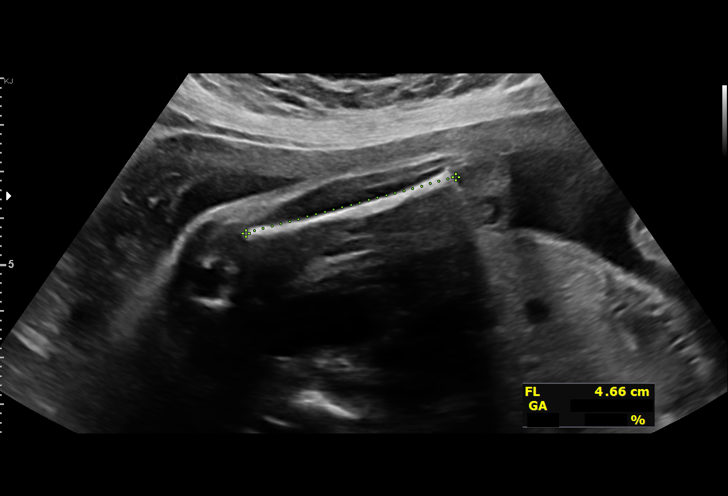
[im 20/48]
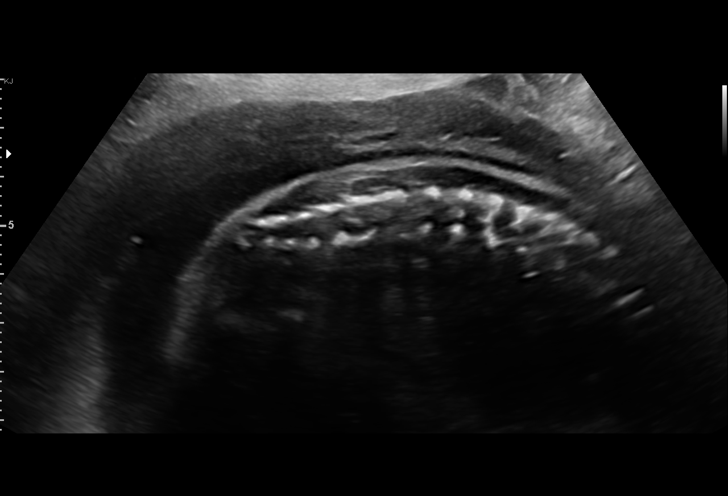
[im 23/48]
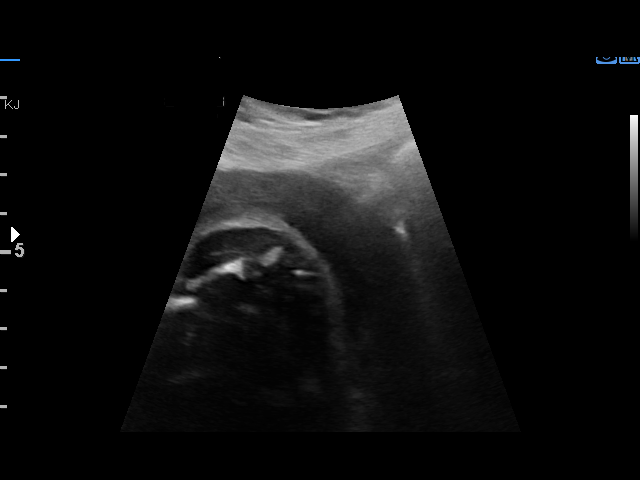
[im 27/48]
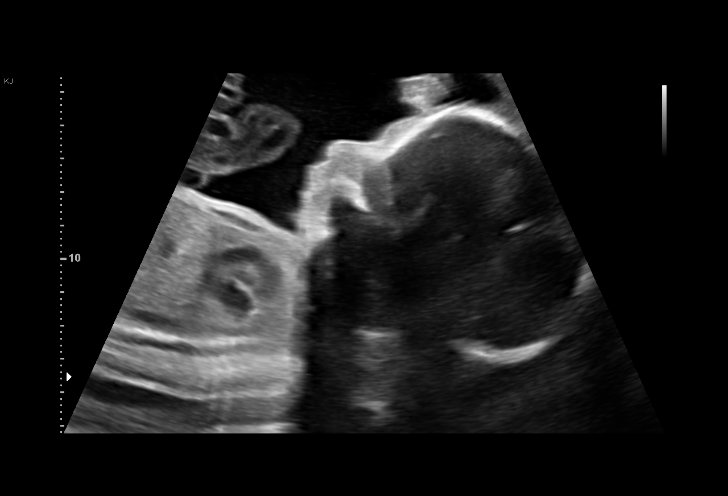
[im 30/48]
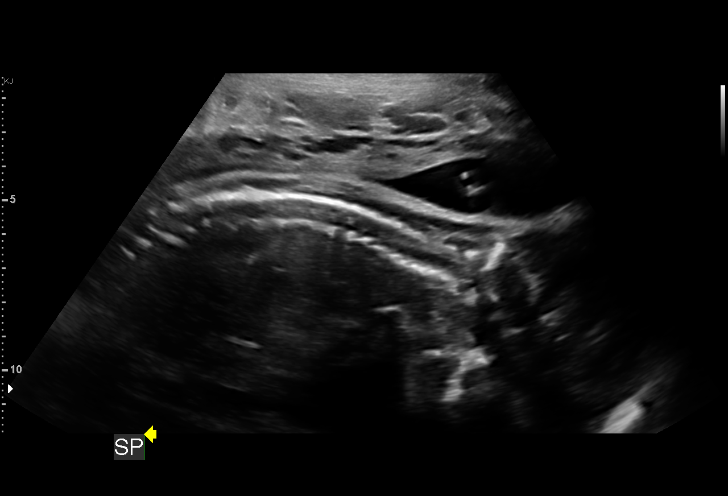
[im 34/48]
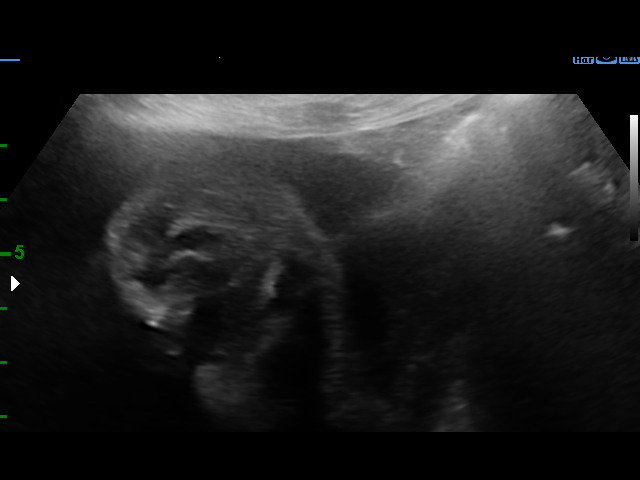
[im 37/48]
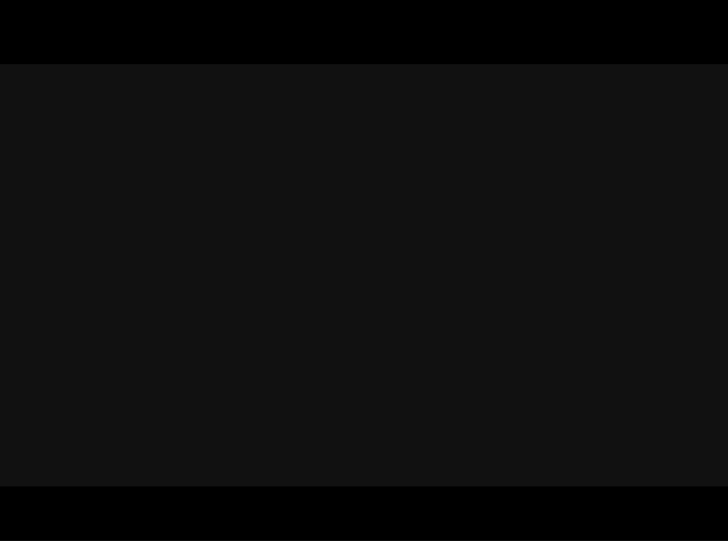
[im 41/48]
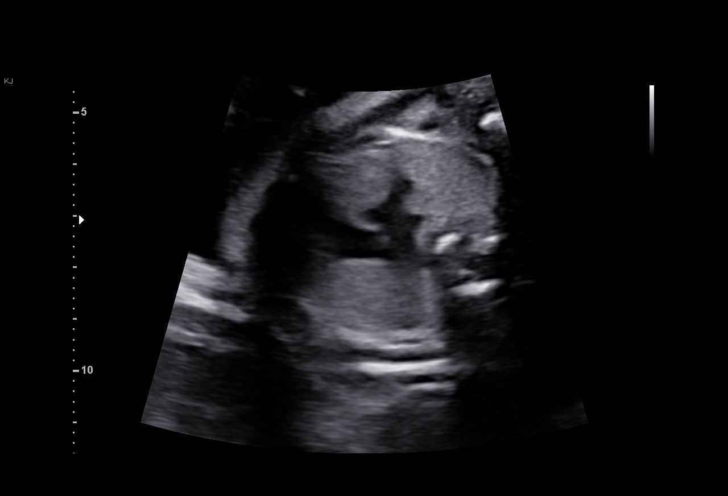
[im 44/48]
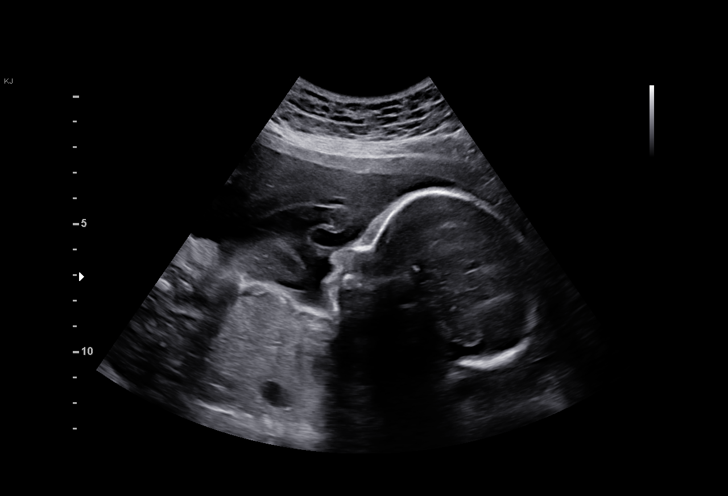
[im 48/48]
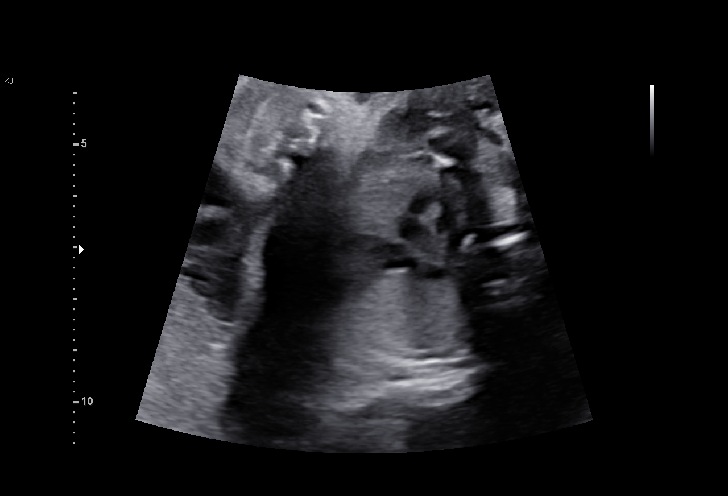

[14 of 28 positions shown; findings below may reference images not displayed]

[REDACTED]
                   SPASIYA CNM

Indications

 Encounter for antenatal screening for
 malformations
 24 weeks gestation of pregnancy
Fetal Evaluation

 Num Of Fetuses:         1
 Fetal Heart Rate(bpm):  147
 Cardiac Activity:       Observed
 Presentation:           Cephalic
 Placenta:               Posterior
 P. Cord Insertion:      Previously Visualized

 Amniotic Fluid
 AFI FV:      Within normal limits

                             Largest Pocket(cm)

Biometry

 BPD:      61.7  mm     G. Age:  25w 0d         49  %    CI:        69.72   %    70 - 86
                                                         FL/HC:      20.2   %    18.7 -
 HC:      235.8  mm     G. Age:  25w 4d         58  %    HC/AC:      1.15        1.04 -
 AC:      205.5  mm     G. Age:  25w 1d         50  %    FL/BPD:     77.1   %    71 - 87
 FL:       47.6  mm     G. Age:  25w 6d         70  %    FL/AC:      23.2   %    20 - 24
 LV:        4.1  mm
 Est. FW:     813  gm    1 lb 13 oz      68  %
Gestational Age

 LMP:           24w 6d        Date:  07/19/20                 EDD:   04/25/21
 U/S Today:     25w 3d                                        EDD:   04/21/21
 Best:          24w 6d     Det. By:  LMP  (07/19/20)          EDD:   04/25/21
Anatomy

 Ventricles:            Appears normal         Kidneys:                Appear normal
 Heart:                 Appears normal         Bladder:                Appears normal
                        (4CH, axis, and
                        situs)
 Diaphragm:             Appears normal         Spine:                  Appears normal
 Stomach:               Appears normal, left
                        sided

 Other:  All other anatomy previously documented as normal. This completes
         the anatomic survey.
Cervix Uterus Adnexa

 Cervix
 Length:           3.14  cm.
 Normal appearance by transabdominal scan.
Comments

 This patient was seen for a follow up exam to complete the
 views of the fetal anatomy.  She denies any problems since
 her last exam.
 She was informed that the fetal growth and amniotic fluid
 level appears appropriate for her gestational age.
 The views of the fetal spine were visualized today.  There
 were no obvious anomalies noted.  The limitations of
 ultrasound in the detection of all anomalies was discussed.
 Follow-up as indicated.

## 2021-11-10 ENCOUNTER — Other Ambulatory Visit: Payer: Self-pay

## 2021-11-10 ENCOUNTER — Encounter: Payer: Self-pay | Admitting: Advanced Practice Midwife

## 2021-11-10 ENCOUNTER — Other Ambulatory Visit (HOSPITAL_COMMUNITY)
Admission: RE | Admit: 2021-11-10 | Discharge: 2021-11-10 | Disposition: A | Discharge status: Home / Self Care | Payer: Medicaid Other | Source: Ambulatory Visit | Attending: Advanced Practice Midwife | Admitting: Advanced Practice Midwife

## 2021-11-10 ENCOUNTER — Ambulatory Visit: Payer: Medicaid Other | Admitting: Advanced Practice Midwife

## 2021-11-10 VITALS — BP 117/79 | HR 72 | Wt 167.0 lb

## 2021-11-10 DIAGNOSIS — R109 Unspecified abdominal pain: Secondary | ICD-10-CM | POA: Diagnosis not present

## 2021-11-10 DIAGNOSIS — N898 Other specified noninflammatory disorders of vagina: Secondary | ICD-10-CM

## 2021-11-10 NOTE — Progress Notes (Signed)
° °  Subjective:    Patient ID: Miranda Caldwell, female    DOB: January 04, 1986, 35 y.o.   MRN: [id number] This is a 35 y.o. female who is 6 months postpartum and 3+ months post Liletta IUD placement, presents with c/o vaginal discharge and dyspareunia.  States has sharp pains every time she has intercourse.  States does not have pain at other tmes Abdominal Pain This is a new problem. The current episode started more than 1 month ago. The problem occurs intermittently. The quality of the pain is sharp. Pertinent negatives include no constipation, diarrhea, dysuria, fever, frequency or headaches. Exacerbated by: Intercourse. The pain is relieved by Nothing. She has tried nothing for the symptoms.  Vaginal Discharge The patient's primary symptoms include a genital odor, pelvic pain and vaginal discharge. The patient's pertinent negatives include no genital itching, genital lesions or vaginal bleeding. The current episode started more than 1 month ago. The problem occurs daily. She is not pregnant. Associated symptoms include abdominal pain. Pertinent negatives include no constipation, diarrhea, dysuria, fever, frequency or headaches. The vaginal discharge was white and thick. There has been no bleeding. She has not been passing clots. She has not been passing tissue. She has tried nothing for the symptoms.     Review of Systems  Constitutional:  Negative for fever.  Gastrointestinal:  Positive for abdominal pain. Negative for constipation and diarrhea.  Genitourinary:  Positive for pelvic pain and vaginal discharge. Negative for dysuria and frequency.  Neurological:  Negative for headaches.      Objective:   Physical Exam Constitutional:      General: She is not in acute distress.    Appearance: She is not ill-appearing.  HENT:     Head: Normocephalic.  Cardiovascular:     Rate and Rhythm: Normal rate.  Pulmonary:     Effort: Pulmonary effort is normal.  Abdominal:     General: There is no  distension.     Palpations: There is no mass.     Tenderness: There is abdominal tenderness (With palpation during bimanual exam). There is no guarding or rebound.     Hernia: No hernia is present.  Genitourinary:    General: Normal vulva.     Comments: Thin clear discharge, IUD string visible,  Uterus and bilateral pelvic tenderness to palpation Skin:    General: Skin is warm and dry.  Neurological:     General: No focal deficit present.     Mental Status: She is alert.  Psychiatric:        Mood and Affect: Mood normal.          Assessment & Plan:  A:   Vaginal discharge        Dyspareunia       Liletta IUD in place  P:    Cultures sent       [place] ordered to evaluate IUD placement        Will follow results and treat accordingly  Miranda Caldwell, CNM

## 2021-11-10 NOTE — Progress Notes (Signed)
Patient has had increase in discharge since having the IUD placed. Patient also states she has had increase in weight gain since IUD insertion. Armandina Stammer RN

## 2021-11-11 LAB — CERVICOVAGINAL ANCILLARY ONLY
Bacterial Vaginitis (gardnerella): NEGATIVE
Candida Glabrata: NEGATIVE
Candida Vaginitis: NEGATIVE
Chlamydia: NEGATIVE
Comment: NEGATIVE
Comment: NEGATIVE
Comment: NEGATIVE
Comment: NEGATIVE
Comment: NEGATIVE
Comment: NORMAL
Neisseria Gonorrhea: NEGATIVE
Trichomonas: NEGATIVE

## 2021-11-13 ENCOUNTER — Encounter (HOSPITAL_BASED_OUTPATIENT_CLINIC_OR_DEPARTMENT_OTHER): Payer: Self-pay

## 2021-11-13 ENCOUNTER — Emergency Department (HOSPITAL_BASED_OUTPATIENT_CLINIC_OR_DEPARTMENT_OTHER)
Admission: EM | Admit: 2021-11-13 | Discharge: 2021-11-13 | Disposition: A | Discharge status: Home / Self Care | Payer: Medicaid Other | Attending: Emergency Medicine | Admitting: Emergency Medicine

## 2021-11-13 ENCOUNTER — Other Ambulatory Visit: Payer: Self-pay

## 2021-11-13 DIAGNOSIS — J029 Acute pharyngitis, unspecified: Secondary | ICD-10-CM | POA: Diagnosis present

## 2021-11-13 DIAGNOSIS — Z20822 Contact with and (suspected) exposure to covid-19: Secondary | ICD-10-CM | POA: Insufficient documentation

## 2021-11-13 DIAGNOSIS — H9209 Otalgia, unspecified ear: Secondary | ICD-10-CM | POA: Insufficient documentation

## 2021-11-13 DIAGNOSIS — J101 Influenza due to other identified influenza virus with other respiratory manifestations: Secondary | ICD-10-CM | POA: Diagnosis not present

## 2021-11-13 LAB — RESP PANEL BY RT-PCR (FLU A&B, COVID) ARPGX2
Influenza A by PCR: POSITIVE — AB
Influenza B by PCR: NEGATIVE
SARS Coronavirus 2 by RT PCR: NEGATIVE

## 2021-11-13 LAB — GROUP A STREP BY PCR: Group A Strep by PCR: NOT DETECTED

## 2021-11-13 MED ORDER — OSELTAMIVIR PHOSPHATE 75 MG PO CAPS: 75.0000 mg | ORAL_CAPSULE | Freq: Two times a day (BID) | ORAL | 0 refills | Status: DC

## 2021-11-13 MED ORDER — ACETAMINOPHEN 80 MG RE SUPP: 80.0000 mg | Freq: Once | RECTAL | Status: DC

## 2021-11-13 NOTE — ED Notes (Signed)
Pt seen by EDP prior to RN assessment, see MD notes, orders received and initiated. Pt seen with family member also a pt.

## 2021-11-13 NOTE — ED Provider Notes (Signed)
[facility] EMERGENCY DEPARTMENT Provider Note   CSN: [id number] Arrival date & time: 11/13/21  1055     History Chief Complaint  Patient presents with   Cough    Miranda Caldwell is a 35 y.o. female.  HPI Level 5 caveat secondary to language barrier History obtained through [place] interpreter 35 year old previously healthy female presents today with her 46-month-old child.  Patient states that she has been had earache and sore throat that began 2 days ago.  Denies fever, nasal congestion, cough, chills.  She has an IUD in place does not believe that she is pregnant.  She has not had a urinary tract infection symptoms.  1-month-old has fever.    History reviewed. No pertinent past medical history.  Patient Active Problem List   Diagnosis Date Noted   Language barrier 09/02/2020    History reviewed. No pertinent surgical history.   OB History     Gravida  1   Para  1   Term  1   Preterm      AB      Living  1      SAB      IAB      Ectopic      Multiple  0   Live Births  1           Family History  Problem Relation Age of Onset   Stroke Neg Hx    Obesity Neg Hx    Hypertension Neg Hx     Social History   Tobacco Use   Smoking status: Never   Smokeless tobacco: Never  Vaping Use   Vaping Use: Never used  Substance Use Topics   Alcohol use: Never   Drug use: Never    Home Medications Prior to Admission medications   Medication Sig Start Date End Date Taking? Authorizing Provider  oseltamivir (TAMIFLU) 75 MG capsule Take 1 capsule (75 mg total) by mouth every 12 (twelve) hours. 11/13/21  Yes Margarita Grizzle, MD  ferrous sulfate 325 (65 FE) MG tablet TAKE 1 TABLET BY MOUTH EVERY OTHER DAY 07/25/21   Levie Heritage, DO  ibuprofen (ADVIL) 600 MG tablet Take 1 tablet (600 mg total) by mouth every 6 (six) hours. 07/15/21   Levie Heritage, DO  ibuprofen (ADVIL) 600 MG tablet Take 1 tablet (600 mg total) by mouth every 6 (six)  hours as needed. 07/14/21   Levie Heritage, DO  medroxyPROGESTERone (PROVERA) 10 MG tablet TAKE 1 TABLET BY MOUTH EVERY DAY 10/07/21   Levie Heritage, DO  Prenatal Vit-Fe Fumarate-FA (PRENATAL MULTIVITAMIN) TABS tablet Take 1 tablet by mouth daily at 12 noon.    [provider]    Allergies    Patient has no known allergies.  Review of Systems   Review of Systems  All other systems reviewed and are negative.  Physical Exam Updated Vital Signs BP 108/79 (BP Location: Right Arm)    Pulse (!) 108    Temp 98.7 F (37.1 C) (Oral)    Resp 18    SpO2 99%   Physical Exam Vitals and nursing note reviewed.  Constitutional:      Appearance: Normal appearance.  HENT:     Head: Normocephalic.     Right Ear: Tympanic membrane and external ear normal.     Left Ear: Tympanic membrane and external ear normal.     Nose: Nose normal.     Mouth/Throat:     Mouth: Mucous membranes are  moist.     Pharynx: Posterior oropharyngeal erythema present.  Eyes:     Pupils: Pupils are equal, round, and reactive to light.  Cardiovascular:     Rate and Rhythm: Normal rate and regular rhythm.  Pulmonary:     Effort: Pulmonary effort is normal.     Breath sounds: Normal breath sounds.  Abdominal:     Palpations: Abdomen is soft.  Musculoskeletal:        General: Normal range of motion.     Cervical back: Normal range of motion.  Skin:    General: Skin is warm and dry.     Capillary Refill: Capillary refill takes less than 2 seconds.  Neurological:     General: No focal deficit present.     Mental Status: She is alert.  Psychiatric:        Mood and Affect: Mood normal.    ED Results / Procedures / Treatments   Labs (all labs ordered are listed, but only abnormal results are displayed) Labs Reviewed  RESP PANEL BY RT-PCR (FLU A&B, COVID) ARPGX2 - Abnormal; Notable for the following components:      Result Value   Influenza A by PCR POSITIVE (*)    All other components within normal  limits  GROUP A STREP BY PCR    EKG None  Radiology No results found.  Procedures Procedures   Medications Ordered in ED Medications  acetaminophen (TYLENOL) suppository 80 mg (has no administration in time range)    ED Course  I have reviewed the triage vital signs and the nursing notes.  Pertinent labs & imaging results that were available during my care of the patient were reviewed by me and considered in my medical decision making (see chart for details).  Clinical Course as of 11/13/21 1227  Fri Nov 13, 2021  1225 Mother here with flu.  She presents also with her 57-month-old who is also fully positive. [DR]    Clinical Course User Index [DR] Margarita Grizzle, MD   MDM Rules/Calculators/A&P                         Well-appearing 35 year old female presents with her infant and has some ear congestion and sore throat.  She is flu a positive.  She appears stable for discharge. Final Clinical Impression(s) / ED Diagnoses Final diagnoses:  Influenza A    Rx / DC Orders ED Discharge Orders          Ordered    oseltamivir (TAMIFLU) 75 MG capsule  Every 12 hours        11/13/21 1226             Margarita Grizzle, MD 11/13/21 1227

## 2021-11-13 NOTE — ED Triage Notes (Signed)
Through video interpreter/French-Pt c/o flu like sx x 2 days-NAD-steady gait-EDP Ray in triage

## 2021-11-18 ENCOUNTER — Ambulatory Visit (HOSPITAL_BASED_OUTPATIENT_CLINIC_OR_DEPARTMENT_OTHER)
Admission: RE | Admit: 2021-11-18 | Discharge: 2021-11-18 | Disposition: A | Discharge status: Home / Self Care | Payer: Medicaid Other | Source: Ambulatory Visit | Attending: Advanced Practice Midwife | Admitting: Advanced Practice Midwife

## 2021-11-18 ENCOUNTER — Other Ambulatory Visit: Payer: Self-pay

## 2021-11-18 DIAGNOSIS — R109 Unspecified abdominal pain: Secondary | ICD-10-CM | POA: Diagnosis present

## 2022-01-06 IMAGING — US US BREAST BX W LOC DEV 1ST LESION IMG BX SPEC US GUIDE*L*
1 series · 12 of 13 positions shown · non-contrast
Comparison: Previous exam(s).
COMPARISON: Previous exam(s).

Addendum:
CLINICAL DATA: Patient presents for ultrasound-guided core biopsy
of LEFT breast mass. Patient is 30 weeks pregnant.

EXAM:
ULTRASOUND GUIDED LEFT BREAST CORE NEEDLE BIOPSY

[Series 1: us breast bx w loc dev 1st lesion img bx spec us g · 0.06mm/px · 12 of 13 slices shown]
[im 1/13]
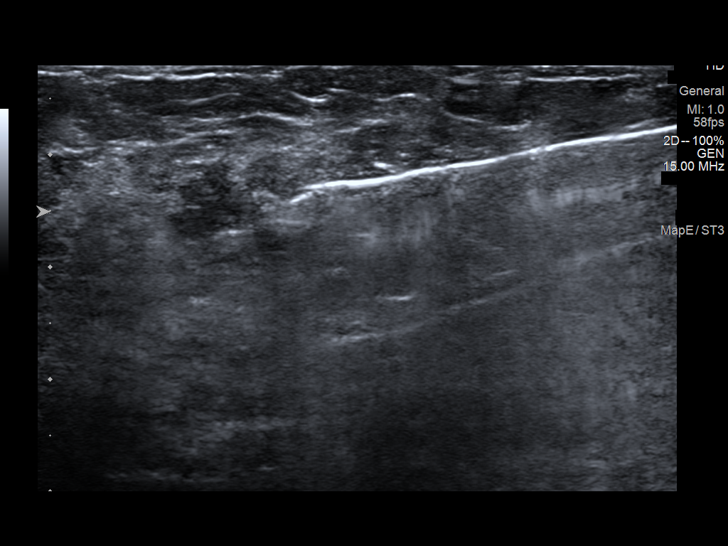
[im 2/13]
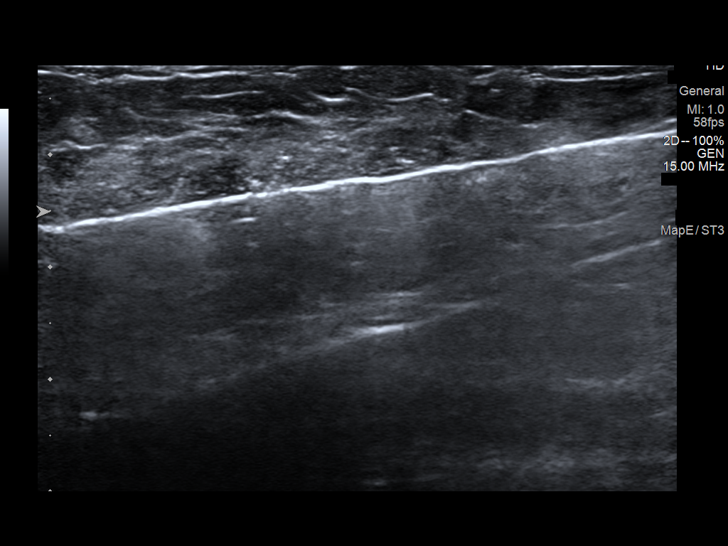
[im 3/13]
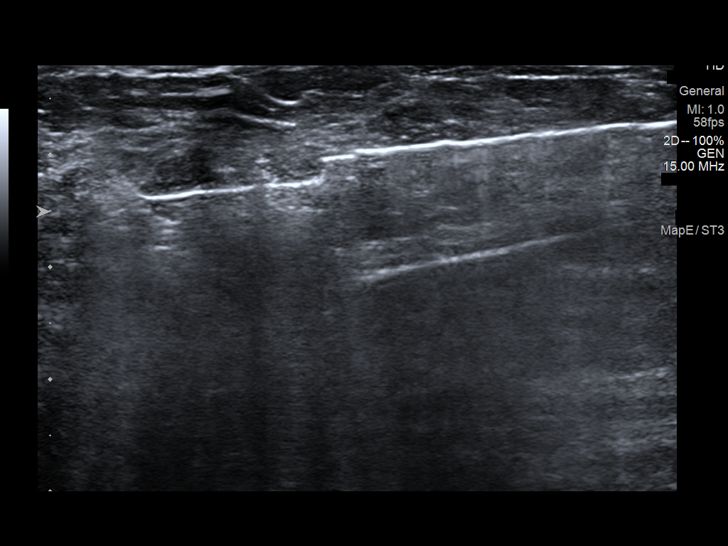
[im 4/13]
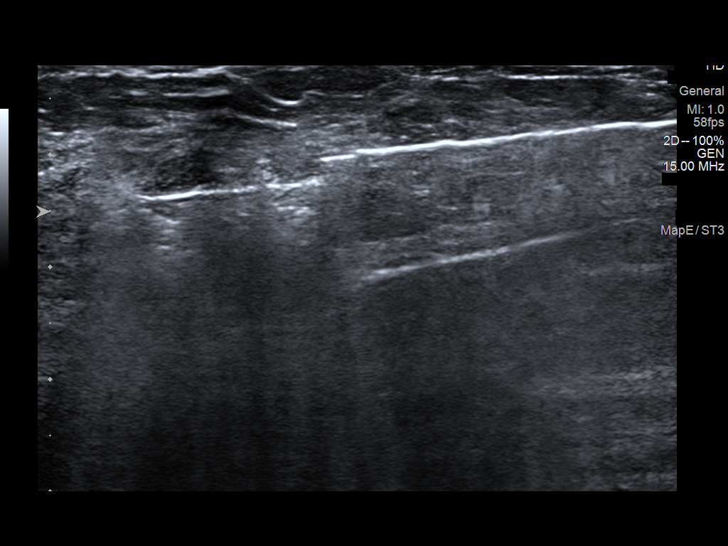
[im 5/13]
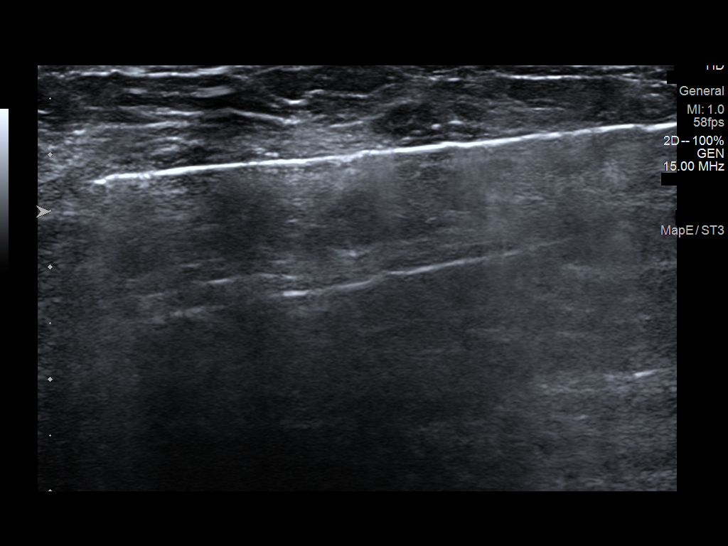
[im 6/13]
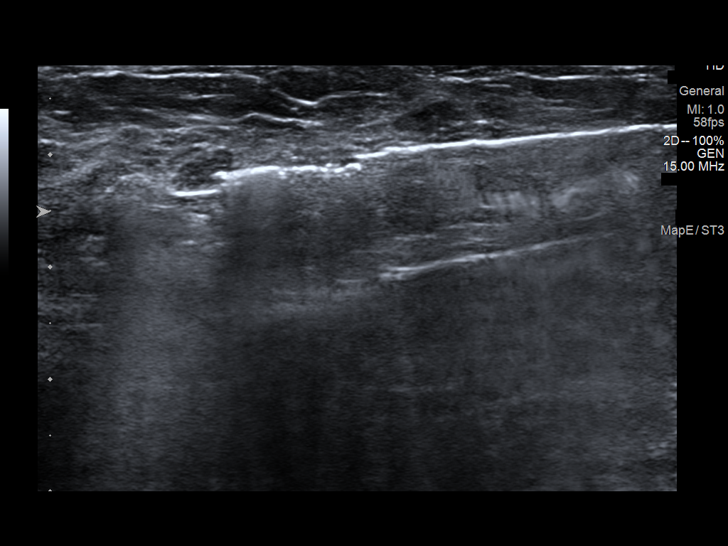
[im 8/13]
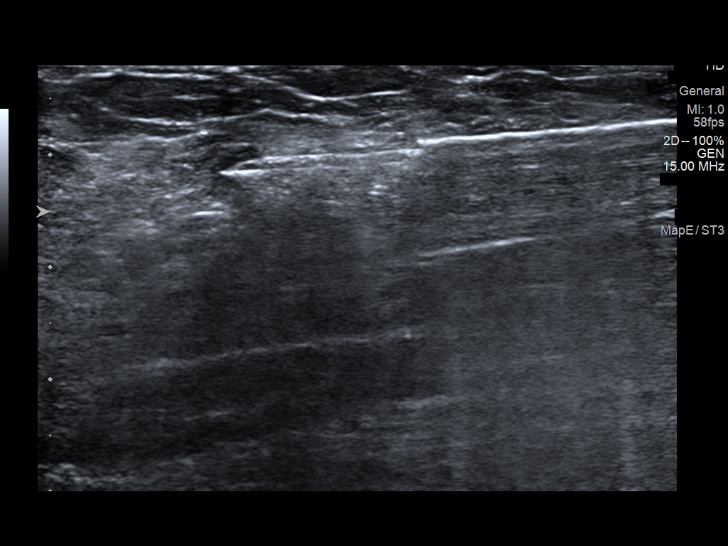
[im 9/13]
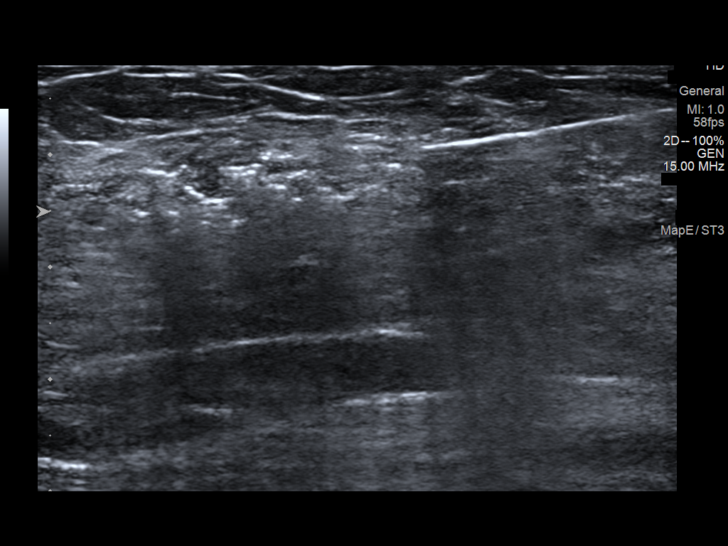
[im 10/13]
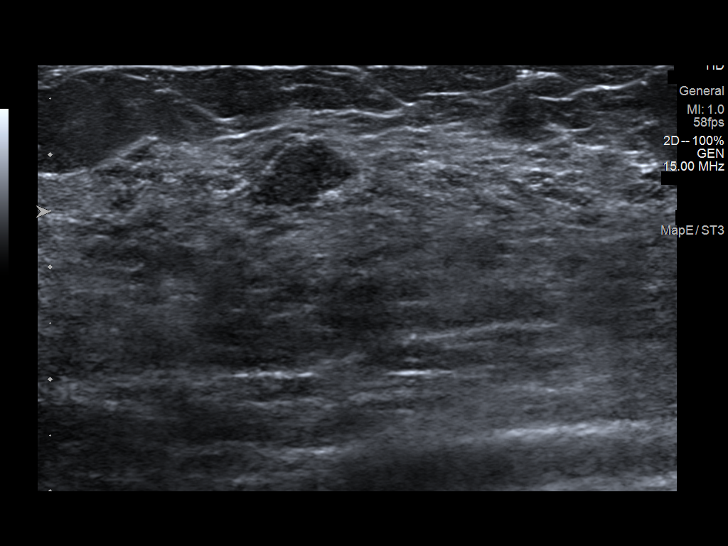
[im 11/13]
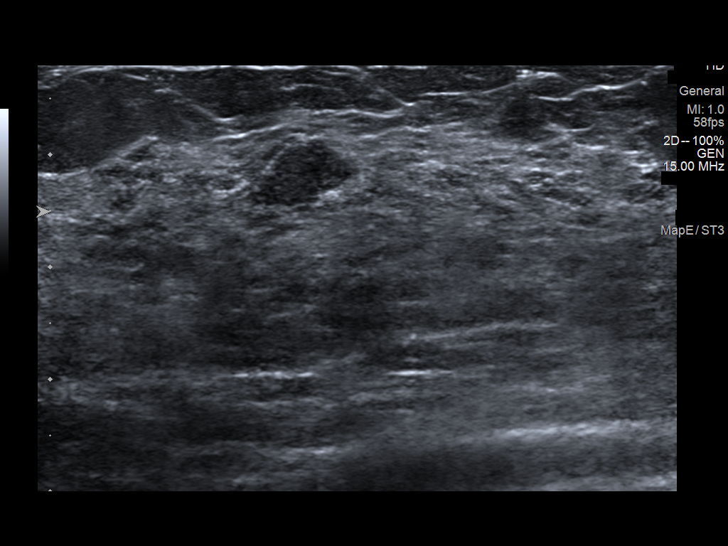
[im 12/13]
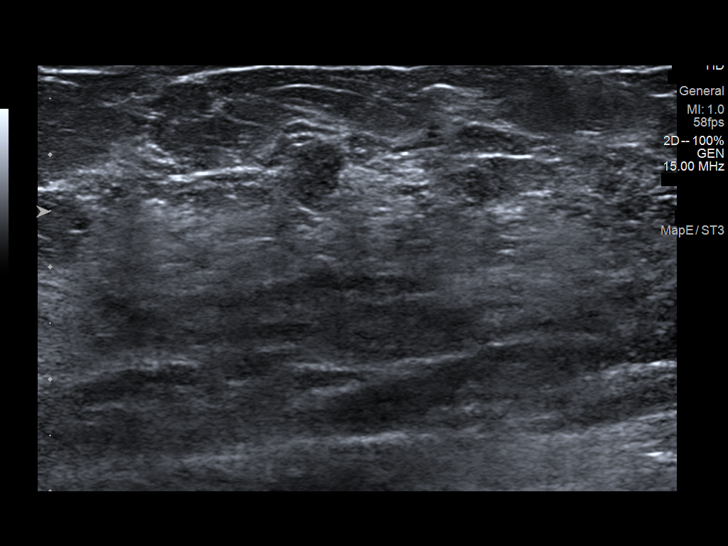
[im 13/13]
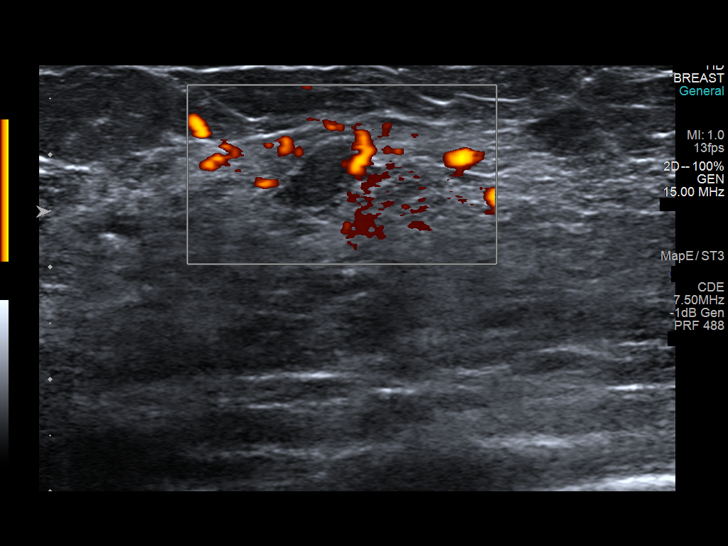

[12 of 13 positions shown; findings below may reference images not displayed]



Lesion quadrant: UPPER-OUTER QUADRANT LEFT breast

Using sterile technique and 1% Lidocaine as local anesthetic, under
direct ultrasound visualization, a 12 gauge Siri device was
used to perform biopsy of mass in the 2 o'clock location of the LEFT
breast 6 centimeters from the nipple using a LATERAL to MEDIAL
approach. At the conclusion of the procedure Q shaped tissue marker
clip was deployed into the biopsy cavity. The clip is well
visualized sonographically, and post clip images are not performed.
IMPRESSION: Ultrasound guided biopsy of LEFT breast mass. No apparent
complications.

ADDENDUM:
Pathology revealed FIBROADENOMA, BACKGROUND LACTATIONAL CHANGE- NO
MALIGNANCY IDENTIFIED of the LEFT breast, upper outer quadrant, 2
o'clock, 4cmfn (Q clip). This was found to be concordant by Dr.
Amenazzy Temple.

Multiple attempts on [DATE] and [DATE] to reach patient on preferred
number- no voicemail set up. Message left for patient to call The
home phone number. My direct number given on voicemail via [place]
[employer]pre[employer]. Noted patient has viewed results on MY CHART.

The patient is instructed to continue with monthly self breast
examinations, clinical follow-up as needed, and to return for annual
mammography at 41. A reminder notice will be sent regarding this
appointment.

Pathology results reported by Blain Jumper RN on 04/03/2021.



Lesion quadrant: UPPER-OUTER QUADRANT LEFT breast

Using sterile technique and 1% Lidocaine as local anesthetic, under
direct ultrasound visualization, a 12 gauge Siri device was
used to perform biopsy of mass in the 2 o'clock location of the LEFT
breast 6 centimeters from the nipple using a LATERAL to MEDIAL
approach. At the conclusion of the procedure Q shaped tissue marker
clip was deployed into the biopsy cavity. The clip is well
visualized sonographically, and post clip images are not performed.
IMPRESSION: Ultrasound guided biopsy of LEFT breast mass. No apparent
complications.

## 2022-01-10 ENCOUNTER — Emergency Department (HOSPITAL_BASED_OUTPATIENT_CLINIC_OR_DEPARTMENT_OTHER)
Admission: EM | Admit: 2022-01-10 | Discharge: 2022-01-10 | Disposition: A | Discharge status: Home / Self Care | Payer: Medicaid Other | Attending: Emergency Medicine | Admitting: Emergency Medicine

## 2022-01-10 ENCOUNTER — Encounter (HOSPITAL_BASED_OUTPATIENT_CLINIC_OR_DEPARTMENT_OTHER): Payer: Self-pay | Admitting: Emergency Medicine

## 2022-01-10 ENCOUNTER — Other Ambulatory Visit: Payer: Self-pay

## 2022-01-10 DIAGNOSIS — R109 Unspecified abdominal pain: Secondary | ICD-10-CM | POA: Insufficient documentation

## 2022-01-10 DIAGNOSIS — R197 Diarrhea, unspecified: Secondary | ICD-10-CM | POA: Insufficient documentation

## 2022-01-10 DIAGNOSIS — R059 Cough, unspecified: Secondary | ICD-10-CM | POA: Diagnosis not present

## 2022-01-10 DIAGNOSIS — R519 Headache, unspecified: Secondary | ICD-10-CM | POA: Diagnosis not present

## 2022-01-10 DIAGNOSIS — R6889 Other general symptoms and signs: Secondary | ICD-10-CM

## 2022-01-10 DIAGNOSIS — R112 Nausea with vomiting, unspecified: Secondary | ICD-10-CM | POA: Diagnosis not present

## 2022-01-10 DIAGNOSIS — R0981 Nasal congestion: Secondary | ICD-10-CM | POA: Diagnosis not present

## 2022-01-10 DIAGNOSIS — Z20822 Contact with and (suspected) exposure to covid-19: Secondary | ICD-10-CM | POA: Diagnosis not present

## 2022-01-10 LAB — RESP PANEL BY RT-PCR (FLU A&B, COVID) ARPGX2
Influenza A by PCR: NEGATIVE
Influenza B by PCR: NEGATIVE
SARS Coronavirus 2 by RT PCR: NEGATIVE

## 2022-01-10 MED ORDER — ONDANSETRON 4 MG PO TBDP
4.0000 mg | ORAL_TABLET | Freq: Once | ORAL | Status: AC
  Administered 2022-01-10: 4 mg via ORAL
  Filled 2022-01-10: qty 1

## 2022-01-10 MED ORDER — ONDANSETRON HCL 4 MG PO TABS: 4.0000 mg | ORAL_TABLET | Freq: Four times a day (QID) | ORAL | 0 refills | Status: DC

## 2022-01-10 NOTE — ED Provider Notes (Signed)
[facility] EMERGENCY DEPARTMENT Provider Note   CSN: [id number] Arrival date & time: 01/10/22  1612     History  Chief Complaint  Patient presents with   Cough   Abdominal Pain    Miranda Caldwell is a 36 y.o. female who presents emergency department complaining of cough, headache, flulike symptoms for the past 4 days.  She reports today she started having abdominal pain, with nausea, vomiting, diarrhea.   Cough Associated symptoms: headaches   Associated symptoms: no chills and no fever   Abdominal Pain Associated symptoms: cough, diarrhea, nausea and vomiting   Associated symptoms: no chills and no fever     History reviewed. No pertinent past medical history.   Home Medications Prior to Admission medications   Medication Sig Start Date End Date Taking? Authorizing Provider  ondansetron (ZOFRAN) 4 MG tablet Take 1 tablet (4 mg total) by mouth every 6 (six) hours. 01/10/22  Yes Tianni Escamilla T, PA-C  ferrous sulfate 325 (65 FE) MG tablet TAKE 1 TABLET BY MOUTH EVERY OTHER DAY 07/25/21   Levie Heritage, DO  ibuprofen (ADVIL) 600 MG tablet Take 1 tablet (600 mg total) by mouth every 6 (six) hours. 07/15/21   Levie Heritage, DO  ibuprofen (ADVIL) 600 MG tablet Take 1 tablet (600 mg total) by mouth every 6 (six) hours as needed. 07/14/21   Levie Heritage, DO  medroxyPROGESTERone (PROVERA) 10 MG tablet TAKE 1 TABLET BY MOUTH EVERY DAY 10/07/21   Levie Heritage, DO  oseltamivir (TAMIFLU) 75 MG capsule Take 1 capsule (75 mg total) by mouth every 12 (twelve) hours. 11/13/21   Margarita Grizzle, MD  Prenatal Vit-Fe Fumarate-FA (PRENATAL MULTIVITAMIN) TABS tablet Take 1 tablet by mouth daily at 12 noon.    [provider]      Allergies    Patient has no known allergies.    Review of Systems   Review of Systems  Constitutional:  Negative for chills and fever.  HENT:  Positive for congestion.   Respiratory:  Positive for cough.   Gastrointestinal:   Positive for abdominal pain, diarrhea, nausea and vomiting.  Neurological:  Positive for headaches.  All other systems reviewed and are negative.  Physical Exam Updated Vital Signs BP 117/78 (BP Location: Left Arm)    Pulse 86    Temp 99.5 F (37.5 C) (Oral)    Resp 18    SpO2 98%  Physical Exam Vitals and nursing note reviewed.  Constitutional:      Appearance: Normal appearance.  HENT:     Head: Normocephalic and atraumatic.     Nose: Congestion present.  Eyes:     Conjunctiva/sclera: Conjunctivae normal.  Cardiovascular:     Rate and Rhythm: Normal rate and regular rhythm.  Pulmonary:     Effort: Pulmonary effort is normal. No respiratory distress.     Breath sounds: Normal breath sounds.  Abdominal:     General: There is no distension.     Palpations: Abdomen is soft.     Tenderness: There is no abdominal tenderness.  Skin:    General: Skin is warm and dry.  Neurological:     General: No focal deficit present.     Mental Status: She is alert.    ED Results / Procedures / Treatments   Labs (all labs ordered are listed, but only abnormal results are displayed) Labs Reviewed  RESP PANEL BY RT-PCR (FLU A&B, COVID) ARPGX2    EKG None  Radiology No results  found.  Procedures Procedures    Medications Ordered in ED Medications  ondansetron (ZOFRAN-ODT) disintegrating tablet 4 mg (4 mg Oral Given 01/10/22 1638)    ED Course/ Medical Decision Making/ A&P                           Medical Decision Making Risk Prescription drug management.   This patient is a 36 year old female who presents to the ED for concern of flulike symptoms. Husband used as [place] interpreter during interview.  Differential diagnoses prior to evaluation: The emergent differential diagnosis includes, but is not limited to, sinusitis, acute otitis media, pneumonia, upper respiratory infection, bronchitis.   Past Medical History / Co-morbidities: History reviewed. No pertinent past  medical history.  Physical Exam: Physical exam performed. The pertinent findings include: Patient is afebrile, not tachycardic, not hypoxic, no acute distress.  Lung sounds are clear to auscultation in all fields.  Abdominal exam nonsurgical.    Lab Tests/Imaging studies: I Ordered, and personally interpreted labs/imaging including respiratory panel.  These results are pending at time of discharge.   Medications: I ordered medication including zofran  for nausea.  I have reviewed the patients home medicines and have made adjustments as needed.   Disposition: After consideration of the diagnostic results and the patients response to treatment, I feel that does not require admission or inpatient treatment for her symptoms. I have low suspicion for developing worsening infection such as pneumonia or other intra-abdominal pathology as patient is clinically well-appearing with normal vital signs.  Discussed with patient that her test results are pending at time of discharge, and gave her resources to follow-up these results on MyChart.  We will treat symptomatically with over-the-counter medications, and prescription for Zofran for nausea.  Discussed reasons to return to the emergency department, and the patient is agreeable to the plan.   Final Clinical Impression(s) / ED Diagnoses Final diagnoses:  Flu-like symptoms  Nausea vomiting and diarrhea    Rx / DC Orders ED Discharge Orders          Ordered    ondansetron (ZOFRAN) 4 MG tablet  Every 6 hours        01/10/22 1651           Portions of this report may have been transcribed using voice recognition software. Every effort was made to ensure accuracy; however, inadvertent computerized transcription errors may be present.    Jeanella Flattery 01/10/22 1653    Virgina Norfolk, DO 01/10/22 1705

## 2022-01-10 NOTE — Discharge Instructions (Addendum)
You are seen in the emergency department today for cough and abdominal pain.  As we discussed we tested you for influenza and COVID.  These results are pending and you can see the results on MyChart later today.   You can sign up for [facility] MyChart to access your medical records and test results using this link: [email or web address]   We normally treat these viral illnesses with over the counter medications like ibuprofen or tylenol for fever/pain, decongestants like Mucinex (taken with a full glass of water), and chloraseptic spray/lozenges/syrup for sore throat.   I am prescribing the nausea medicine that you can take every 6 hours as needed. Your dose in the ER was at 4:30pm, so you can have your next dose at 10:30pm.  Continue to monitor how you're doing and return to the ER for new or worsening symptoms such as fever, inability to keep down medication, shortness of breath.   It has been a pleasure seeing and caring for you today and I hope you start feeling better soon!

## 2022-01-10 NOTE — ED Triage Notes (Signed)
Pt reports cough, HA, flu like symptoms for past 4 days. Today having n/v/d and abd pain.

## 2022-05-26 ENCOUNTER — Encounter (HOSPITAL_BASED_OUTPATIENT_CLINIC_OR_DEPARTMENT_OTHER): Payer: Self-pay

## 2022-05-26 ENCOUNTER — Emergency Department (HOSPITAL_BASED_OUTPATIENT_CLINIC_OR_DEPARTMENT_OTHER)
Admission: EM | Admit: 2022-05-26 | Discharge: 2022-05-26 | Disposition: A | Discharge status: Home / Self Care | Payer: Medicaid Other | Attending: Emergency Medicine | Admitting: Emergency Medicine

## 2022-05-26 ENCOUNTER — Other Ambulatory Visit: Payer: Self-pay

## 2022-05-26 DIAGNOSIS — R197 Diarrhea, unspecified: Secondary | ICD-10-CM | POA: Diagnosis not present

## 2022-05-26 DIAGNOSIS — R059 Cough, unspecified: Secondary | ICD-10-CM | POA: Diagnosis present

## 2022-05-26 DIAGNOSIS — Z20822 Contact with and (suspected) exposure to covid-19: Secondary | ICD-10-CM | POA: Insufficient documentation

## 2022-05-26 DIAGNOSIS — B349 Viral infection, unspecified: Secondary | ICD-10-CM | POA: Insufficient documentation

## 2022-05-26 DIAGNOSIS — R109 Unspecified abdominal pain: Secondary | ICD-10-CM | POA: Insufficient documentation

## 2022-05-26 LAB — SARS CORONAVIRUS 2 BY RT PCR: SARS Coronavirus 2 by RT PCR: NEGATIVE

## 2022-05-26 MED ORDER — ONDANSETRON HCL 4 MG/2ML IJ SOLN
4.0000 mg | Freq: Once | INTRAMUSCULAR | Status: AC
  Administered 2022-05-26: 4 mg via INTRAVENOUS
  Filled 2022-05-26: qty 2

## 2022-05-26 MED ORDER — SODIUM CHLORIDE 0.9 % IV BOLUS
1000.0000 mL | Freq: Once | INTRAVENOUS | Status: AC
  Administered 2022-05-26: 1000 mL via INTRAVENOUS

## 2022-05-26 MED ORDER — ONDANSETRON 4 MG PO TBDP: 4.0000 mg | ORAL_TABLET | Freq: Three times a day (TID) | ORAL | 0 refills | Status: DC | PRN

## 2022-05-26 NOTE — ED Provider Notes (Signed)
[facility] EMERGENCY DEPARTMENT Provider Note   CSN: [id number] Arrival date & time: 05/26/22  0750     History  Chief Complaint  Patient presents with   Fever    nau   Abdominal Pain   Nausea   Diarrhea   Cough    Miranda Caldwell is a 36 y.o. female.   Fever Associated symptoms: cough and diarrhea   Abdominal Pain Associated symptoms: cough, diarrhea and fever   Diarrhea Associated symptoms: abdominal pain and fever   Cough Associated symptoms: fever   Patient presents about a 4-day history of nausea vomiting diarrhea cough.  Has had fevers.  Similar symptoms last week from her child.  Feels fatigued.  No productive cough.  No blood in the emesis or diarrhea.  Mild abdominal pain.  She is otherwise healthy.    History reviewed. No pertinent past medical history.  Home Medications Prior to Admission medications   Medication Sig Start Date End Date Taking? Authorizing Provider  ondansetron (ZOFRAN-ODT) 4 MG disintegrating tablet Take 1 tablet (4 mg total) by mouth every 8 (eight) hours as needed for nausea or vomiting. 05/26/22  Yes Benjiman Core, MD  ferrous sulfate 325 (65 FE) MG tablet TAKE 1 TABLET BY MOUTH EVERY OTHER DAY 07/25/21   Levie Heritage, DO  ibuprofen (ADVIL) 600 MG tablet Take 1 tablet (600 mg total) by mouth every 6 (six) hours. 07/15/21   Levie Heritage, DO  ibuprofen (ADVIL) 600 MG tablet Take 1 tablet (600 mg total) by mouth every 6 (six) hours as needed. 07/14/21   Levie Heritage, DO  medroxyPROGESTERone (PROVERA) 10 MG tablet TAKE 1 TABLET BY MOUTH EVERY DAY 10/07/21   Levie Heritage, DO  ondansetron (ZOFRAN) 4 MG tablet Take 1 tablet (4 mg total) by mouth every 6 (six) hours. 01/10/22   Roemhildt, Lorin T, PA-C  oseltamivir (TAMIFLU) 75 MG capsule Take 1 capsule (75 mg total) by mouth every 12 (twelve) hours. 11/13/21   Margarita Grizzle, MD  Prenatal Vit-Fe Fumarate-FA (PRENATAL MULTIVITAMIN) TABS tablet Take 1 tablet by mouth daily  at 12 noon.    [provider]      Allergies    Patient has no known allergies.    Review of Systems   Review of Systems  Constitutional:  Positive for fever.  Respiratory:  Positive for cough.   Gastrointestinal:  Positive for abdominal pain and diarrhea.    Physical Exam Updated Vital Signs BP 103/73   Pulse 65   Temp 97.9 F (36.6 C) (Oral)   Resp 16   Ht 5\' 4"  (1.626 m)   Wt 70 kg   SpO2 98%   BMI 26.49 kg/m  Physical Exam Vitals reviewed.  Constitutional:      Appearance: She is well-developed.  Pulmonary:     Breath sounds: No wheezing or rhonchi.  Abdominal:     Tenderness: There is no abdominal tenderness.     Hernia: No hernia is present.  Skin:    General: Skin is warm.     Capillary Refill: Capillary refill takes less than 2 seconds.  Neurological:     Mental Status: She is alert and oriented to person, place, and time.     ED Results / Procedures / Treatments   Labs (all labs ordered are listed, but only abnormal results are displayed) Labs Reviewed  SARS CORONAVIRUS 2 BY RT PCR    EKG None  Radiology No results found.  Procedures Procedures  Medications Ordered in ED Medications  ondansetron (ZOFRAN) injection 4 mg (4 mg Intravenous Given 05/26/22 0815)  sodium chloride 0.9 % bolus 1,000 mL (0 mLs Intravenous Stopped 05/26/22 0906)    ED Course/ Medical Decision Making/ A&P                           Medical Decision Making Risk Prescription drug management.  Patient presents with fevers chills nausea vomiting diarrhea and cough.  Likely viral syndrome.  Differential diagnosis does include pneumonia and significant intra-abdominal pathology however.  Lungs are clear.  No auscultated pneumonia.  Do not feel of any chest x-ray at this time.  Benign abdominal exam makes intra-abdominal pathology less likely.  Fluid bolus given as was some Zofran.  Felt better and is tolerated oral in the ER.  Will discharge home with  symptomatic treatment.  Work note provided.  Does not appear to require admission to the hospital at this time.        Final Clinical Impression(s) / ED Diagnoses Final diagnoses:  Viral syndrome    Rx / DC Orders ED Discharge Orders          Ordered    ondansetron (ZOFRAN-ODT) 4 MG disintegrating tablet  Every 8 hours PRN        05/26/22 0921              Benjiman Core, MD 05/26/22 [phone]

## 2022-05-26 NOTE — ED Notes (Signed)
Juice and crackers provided to patient for PO challenge.

## 2022-05-26 NOTE — ED Triage Notes (Addendum)
Pt c/o fever, cough, abdominal discomfort, nausea, vomiting, and diarrhea since Sunday. States her child had same symptoms last week

## 2022-06-28 ENCOUNTER — Emergency Department (HOSPITAL_BASED_OUTPATIENT_CLINIC_OR_DEPARTMENT_OTHER)
Admission: EM | Admit: 2022-06-28 | Discharge: 2022-06-28 | Disposition: A | Discharge status: Home / Self Care | Payer: Medicaid Other | Attending: Emergency Medicine | Admitting: Emergency Medicine

## 2022-06-28 ENCOUNTER — Encounter (HOSPITAL_BASED_OUTPATIENT_CLINIC_OR_DEPARTMENT_OTHER): Payer: Self-pay | Admitting: Emergency Medicine

## 2022-06-28 ENCOUNTER — Other Ambulatory Visit: Payer: Self-pay

## 2022-06-28 DIAGNOSIS — Z20822 Contact with and (suspected) exposure to covid-19: Secondary | ICD-10-CM | POA: Insufficient documentation

## 2022-06-28 DIAGNOSIS — J020 Streptococcal pharyngitis: Secondary | ICD-10-CM | POA: Insufficient documentation

## 2022-06-28 DIAGNOSIS — R509 Fever, unspecified: Secondary | ICD-10-CM | POA: Diagnosis present

## 2022-06-28 LAB — GROUP A STREP BY PCR: Group A Strep by PCR: DETECTED — AB

## 2022-06-28 LAB — SARS CORONAVIRUS 2 BY RT PCR: SARS Coronavirus 2 by RT PCR: NEGATIVE

## 2022-06-28 MED ORDER — ACETAMINOPHEN 325 MG PO TABS
650.0000 mg | ORAL_TABLET | Freq: Once | ORAL | Status: AC
  Administered 2022-06-28: 650 mg via ORAL
  Filled 2022-06-28: qty 2

## 2022-06-28 MED ORDER — IBUPROFEN 800 MG PO TABS
800.0000 mg | ORAL_TABLET | Freq: Once | ORAL | Status: AC
  Administered 2022-06-28: 800 mg via ORAL
  Filled 2022-06-28: qty 1

## 2022-06-28 MED ORDER — AMOXICILLIN 500 MG PO CAPS
500.0000 mg | ORAL_CAPSULE | Freq: Once | ORAL | Status: AC
Start: 2022-06-28 — End: 2022-06-28
  Administered 2022-06-28: 500 mg via ORAL
  Filled 2022-06-28: qty 1

## 2022-06-28 MED ORDER — AMOXICILLIN 500 MG PO CAPS: 500.0000 mg | ORAL_CAPSULE | Freq: Three times a day (TID) | ORAL | 0 refills | Status: DC

## 2022-06-28 MED ORDER — IBUPROFEN 600 MG PO TABS: 600.0000 mg | ORAL_TABLET | Freq: Four times a day (QID) | ORAL | 0 refills | Status: DC | PRN

## 2022-06-28 NOTE — ED Provider Notes (Signed)
[facility] EMERGENCY DEPARTMENT Provider Note   CSN: [id number] Arrival date & time: 06/28/22  1702     History  Chief Complaint  Patient presents with   Fever   Sore Throat    Miranda Caldwell is a 36 y.o. female.  Pt is a 36 yo female with no significant pmhx.  She has had fever, cough, and sore throat for about a week.  She did have a fever here.  No known sick exposures.         Home Medications Prior to Admission medications   Medication Sig Start Date End Date Taking? Authorizing Provider  amoxicillin (AMOXIL) 500 MG capsule Take 1 capsule (500 mg total) by mouth 3 (three) times daily. 06/28/22  Yes Jacalyn Lefevre, MD  ibuprofen (ADVIL) 600 MG tablet Take 1 tablet (600 mg total) by mouth every 6 (six) hours as needed. 06/28/22  Yes Jacalyn Lefevre, MD  ferrous sulfate 325 (65 FE) MG tablet TAKE 1 TABLET BY MOUTH EVERY OTHER DAY 07/25/21   Levie Heritage, DO  medroxyPROGESTERone (PROVERA) 10 MG tablet TAKE 1 TABLET BY MOUTH EVERY DAY 10/07/21   Levie Heritage, DO  ondansetron (ZOFRAN) 4 MG tablet Take 1 tablet (4 mg total) by mouth every 6 (six) hours. 01/10/22   Roemhildt, Lorin T, PA-C  ondansetron (ZOFRAN-ODT) 4 MG disintegrating tablet Take 1 tablet (4 mg total) by mouth every 8 (eight) hours as needed for nausea or vomiting. 05/26/22   Benjiman Core, MD  oseltamivir (TAMIFLU) 75 MG capsule Take 1 capsule (75 mg total) by mouth every 12 (twelve) hours. 11/13/21   Margarita Grizzle, MD  Prenatal Vit-Fe Fumarate-FA (PRENATAL MULTIVITAMIN) TABS tablet Take 1 tablet by mouth daily at 12 noon.    [provider]      Allergies    Patient has no known allergies.    Review of Systems   Review of Systems  Constitutional:  Positive for fever.  HENT:  Positive for sore throat.   Respiratory:  Positive for cough.   All other systems reviewed and are negative.   Physical Exam Updated Vital Signs BP 109/78 (BP Location: Left Arm)   Pulse (!) 112    Temp (!) 101.4 F (38.6 C) (Oral)   Resp 15   SpO2 100%   Breastfeeding No  Physical Exam Vitals and nursing note reviewed.  Constitutional:      Appearance: She is well-developed.  HENT:     Head: Normocephalic and atraumatic.     Mouth/Throat:     Pharynx: Posterior oropharyngeal erythema present.  Eyes:     Conjunctiva/sclera: Conjunctivae normal.     Pupils: Pupils are equal, round, and reactive to light.  Cardiovascular:     Rate and Rhythm: Normal rate and regular rhythm.     Heart sounds: Normal heart sounds.  Abdominal:     Palpations: Abdomen is soft.  Musculoskeletal:     Cervical back: Normal range of motion.  Lymphadenopathy:     Cervical: Cervical adenopathy present.  Skin:    General: Skin is warm.     Capillary Refill: Capillary refill takes less than 2 seconds.  Neurological:     General: No focal deficit present.     Mental Status: She is alert and oriented to person, place, and time.  Psychiatric:        Mood and Affect: Mood normal.        Behavior: Behavior normal.     ED Results / Procedures /  Treatments   Labs (all labs ordered are listed, but only abnormal results are displayed) Labs Reviewed  GROUP A STREP BY PCR - Abnormal; Notable for the following components:      Result Value   Group A Strep by PCR DETECTED (*)    All other components within normal limits  SARS CORONAVIRUS 2 BY RT PCR    EKG None  Radiology No results found.  Procedures Procedures    Medications Ordered in ED Medications  amoxicillin (AMOXIL) capsule 500 mg (has no administration in time range)  acetaminophen (TYLENOL) tablet 650 mg (has no administration in time range)  ibuprofen (ADVIL) tablet 800 mg (800 mg Oral Given 06/28/22 1719)    ED Course/ Medical Decision Making/ A&P                           Medical Decision Making Risk OTC drugs. Prescription drug management.   Covid neg.  Strep +.  Pt offered a bicillin injection, but preferred the oral  pills.  Pt is stable for d/c.  She is to return if worse.  F/u with pcp.        Final Clinical Impression(s) / ED Diagnoses Final diagnoses:  Strep pharyngitis    Rx / DC Orders ED Discharge Orders          Ordered    amoxicillin (AMOXIL) 500 MG capsule  3 times daily        06/28/22 1830    ibuprofen (ADVIL) 600 MG tablet  Every 6 hours PRN        06/28/22 1830              Jacalyn Lefevre, MD 06/28/22 1834

## 2022-06-28 NOTE — ED Triage Notes (Addendum)
Pt POV c/o cough, sore throat x1 week.   Tylenol last taken 1200 today.

## 2022-08-25 IMAGING — US US PELVIS COMPLETE
1 series · 14 of 25 positions shown · non-contrast
Comparison: None available.

CLINICAL DATA: Initial evaluation for abdominal pain, check IUD
placement.

EXAM:
TRANSABDOMINAL ULTRASOUND OF PELVIS
TECHNIQUE: Transabdominal ultrasound examination of the pelvis was performed
including evaluation of the uterus, ovaries, adnexal regions, and
pelvic cul-de-sac.

[Series 1: us pelvis complete · 14 of 68 slices shown]
[im 1/68]
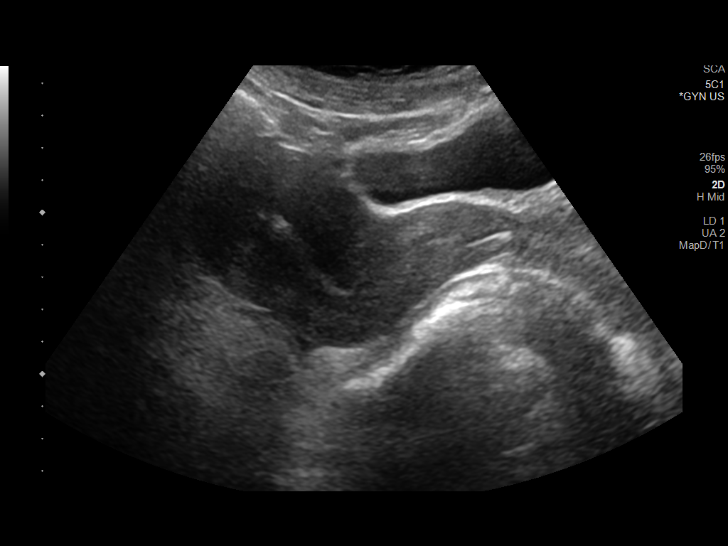
[im 6/68]
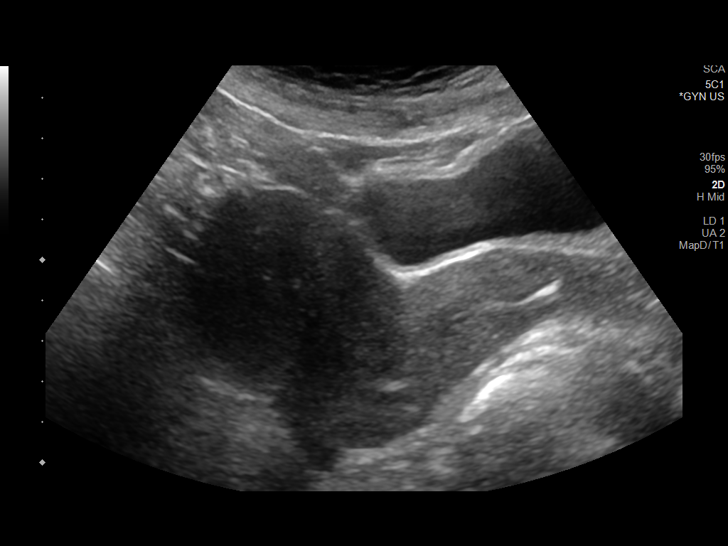
[im 12/68]
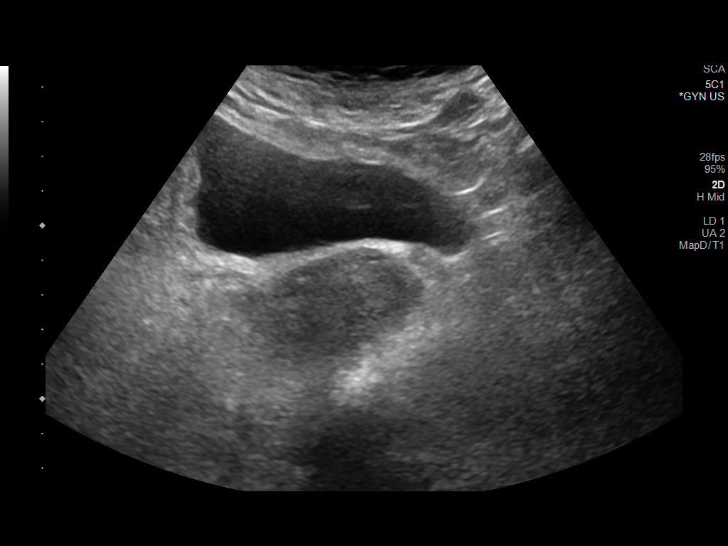
[im 17/68]
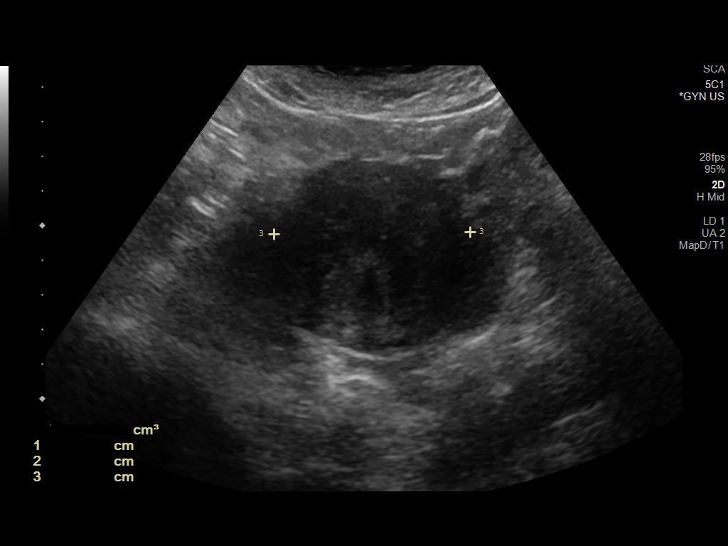
[im 23/68]
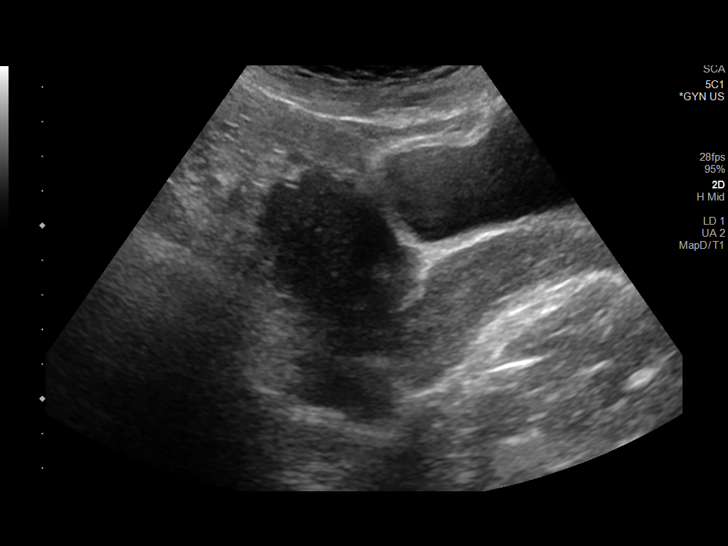
[im 26/68]
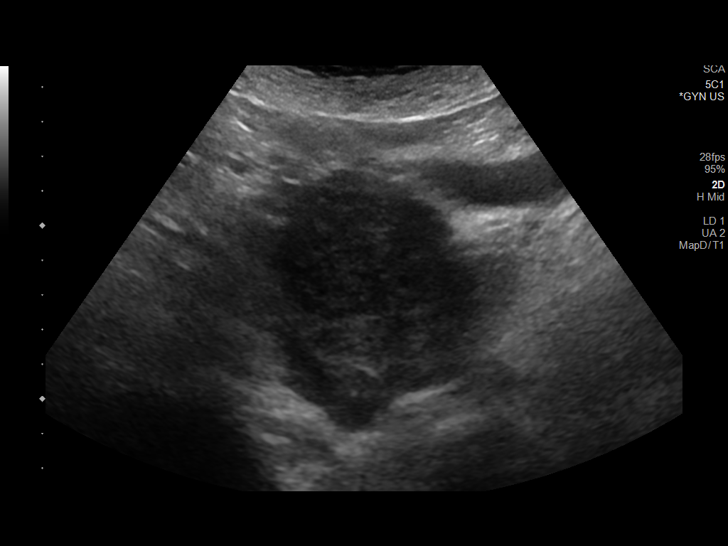
[im 31/68]
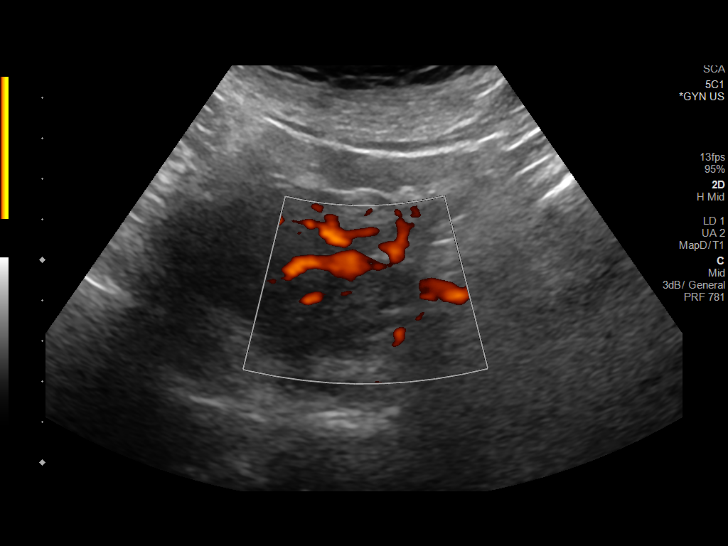
[im 37/68]
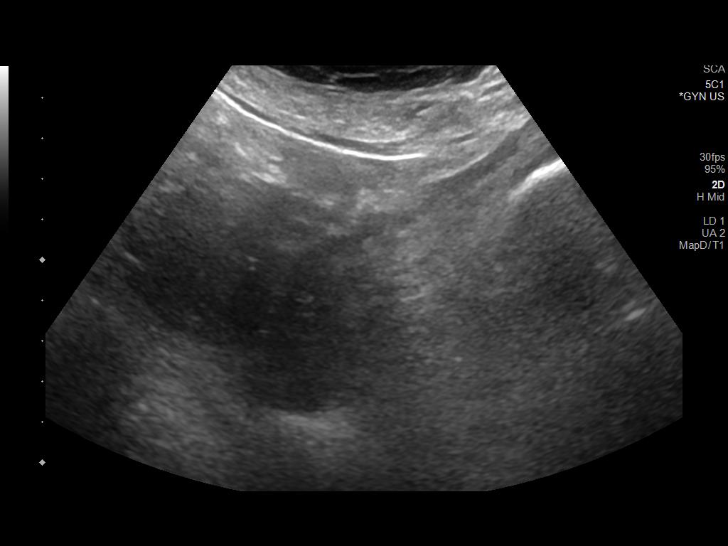
[im 42/68]
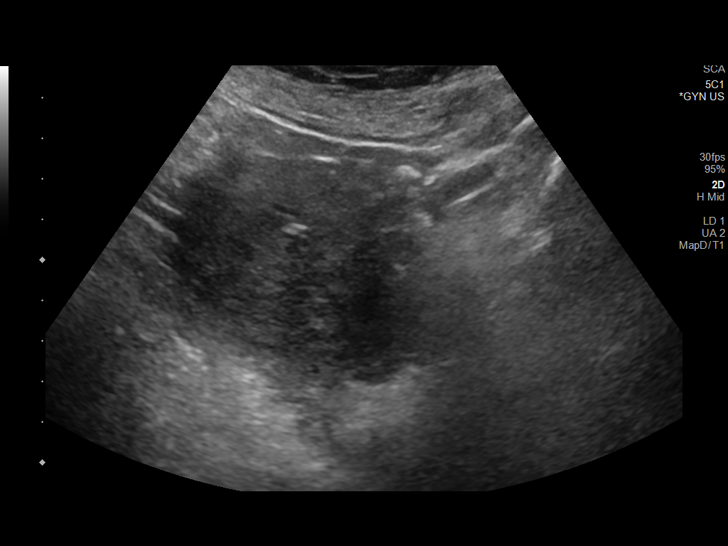
[im 45/68]
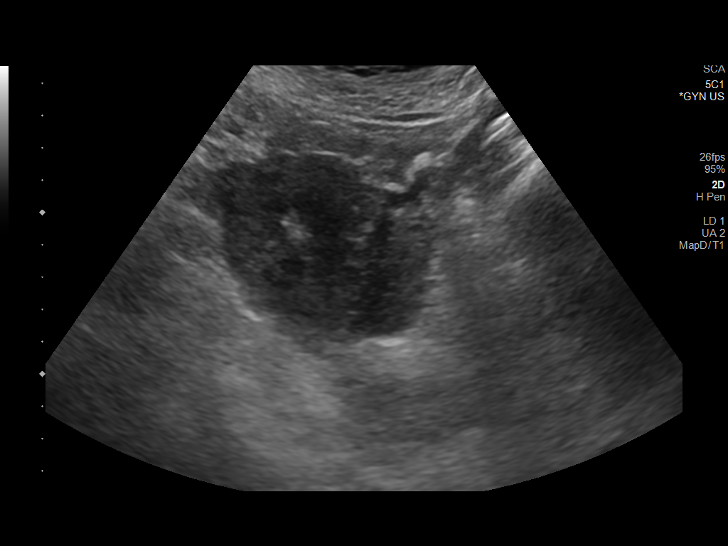
[im 51/68]
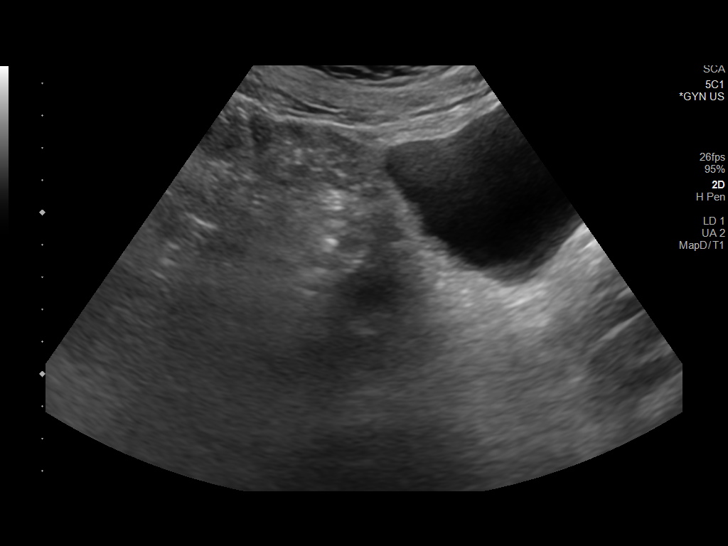
[im 56/68]
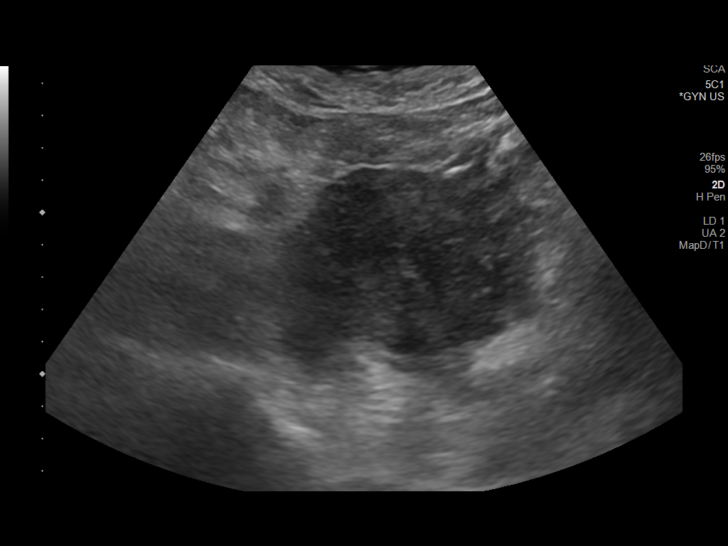
[im 62/68]
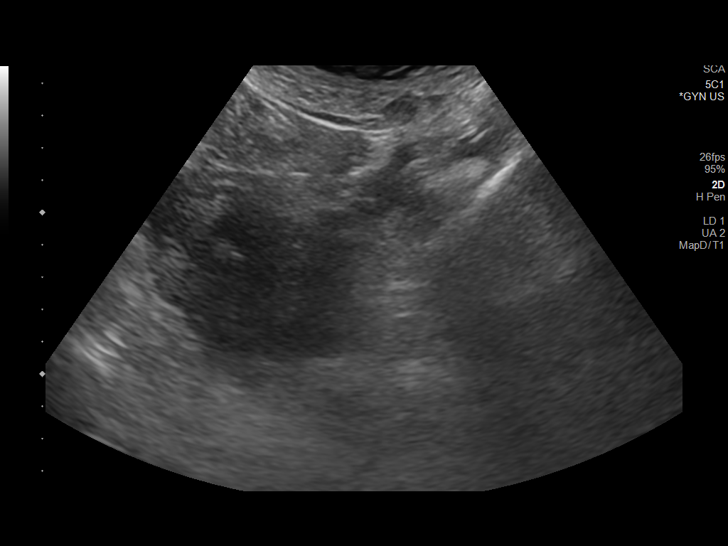
[im 68/68]
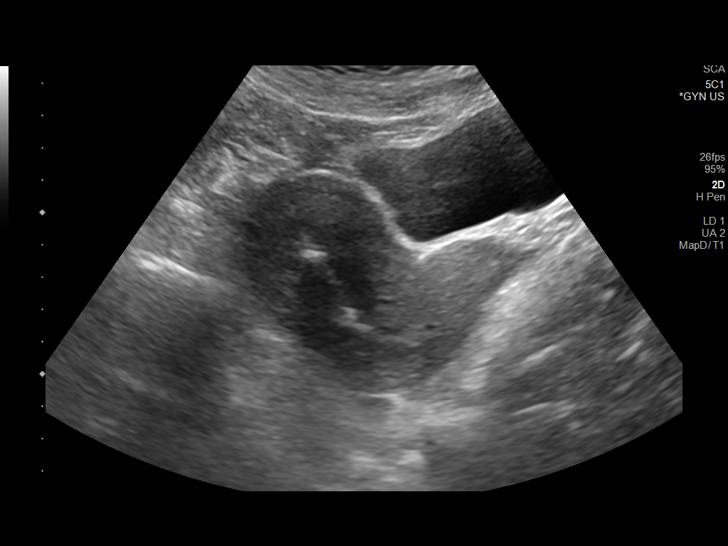

[14 of 25 positions shown; findings below may reference images not displayed]

FINDINGS: Uterus

Measurements: 7.6 x 4.9 x 5.7 cm = volume: 110.6 mL. Uterus is
anteverted. No discrete fibroid or other mass.

Endometrium

Thickness: 2.9 mm. No focal abnormality visualized. IUD appropriate
position within the upper endometrial cavity of the level uterine
fundus/body.

Right ovary

Measurements: 3.3 x 1.8 x 3.0 cm = volume: 8.8 mL. Normal
appearance/no adnexal mass.

Left ovary

Measurements: 3.2 x 1.9 x 2.0 cm = volume: 6.3 mL. Normal
appearance/no adnexal mass.

Other findings: Trace free physiologic fluid present within the
pelvis.
IMPRESSION: 1. IUD appropriately positioned within the upper endometrial cavity
of the level of the uterine fundus/body.
2. Otherwise unremarkable and normal pelvic ultrasound.

## 2023-03-29 ENCOUNTER — Encounter (HOSPITAL_BASED_OUTPATIENT_CLINIC_OR_DEPARTMENT_OTHER): Payer: Self-pay | Admitting: Pediatrics

## 2023-03-29 ENCOUNTER — Emergency Department (HOSPITAL_BASED_OUTPATIENT_CLINIC_OR_DEPARTMENT_OTHER)
Admission: EM | Admit: 2023-03-29 | Discharge: 2023-03-29 | Disposition: A | Discharge status: Home / Self Care | Payer: Medicaid Other | Attending: Emergency Medicine | Admitting: Emergency Medicine

## 2023-03-29 ENCOUNTER — Other Ambulatory Visit: Payer: Self-pay

## 2023-03-29 DIAGNOSIS — J029 Acute pharyngitis, unspecified: Secondary | ICD-10-CM | POA: Insufficient documentation

## 2023-03-29 LAB — GROUP A STREP BY PCR: Group A Strep by PCR: NOT DETECTED

## 2023-03-29 MED ORDER — DEXAMETHASONE 4 MG PO TABS
10.0000 mg | ORAL_TABLET | Freq: Once | ORAL | Status: AC
Start: 2023-03-29 — End: 2023-03-29
  Administered 2023-03-29: 10 mg via ORAL
  Filled 2023-03-29: qty 3

## 2023-03-29 NOTE — ED Provider Notes (Signed)
[place] [facility] AT [facility] Provider Note   CSN: [id number] Arrival date & time: 03/29/23  1748     History Chief Complaint  Patient presents with   Sore Throat    Miranda Caldwell is a 37 y.o. female.  Patient presents emergency department complaints of sore throat.  She reports that this began last night.  Also reports associated fever but has not measured.  Has not had any known sick contacts at home recently. States that she is having some difficulty with eating and drinking due to pain, but denies any chest pain, difficulty swallowing, or shortness of breath.   Sore Throat       Home Medications Prior to Admission medications   Medication Sig Start Date End Date Taking? Authorizing Provider  amoxicillin (AMOXIL) 500 MG capsule Take 1 capsule (500 mg total) by mouth 3 (three) times daily. 06/28/22   Jacalyn Lefevre, MD  ferrous sulfate 325 (65 FE) MG tablet TAKE 1 TABLET BY MOUTH EVERY OTHER DAY 07/25/21   Levie Heritage, DO  ibuprofen (ADVIL) 600 MG tablet Take 1 tablet (600 mg total) by mouth every 6 (six) hours as needed. 06/28/22   Jacalyn Lefevre, MD  medroxyPROGESTERone (PROVERA) 10 MG tablet TAKE 1 TABLET BY MOUTH EVERY DAY 10/07/21   Levie Heritage, DO  ondansetron (ZOFRAN) 4 MG tablet Take 1 tablet (4 mg total) by mouth every 6 (six) hours. 01/10/22   Roemhildt, Lorin T, PA-C  ondansetron (ZOFRAN-ODT) 4 MG disintegrating tablet Take 1 tablet (4 mg total) by mouth every 8 (eight) hours as needed for nausea or vomiting. 05/26/22   Benjiman Core, MD  oseltamivir (TAMIFLU) 75 MG capsule Take 1 capsule (75 mg total) by mouth every 12 (twelve) hours. 11/13/21   Margarita Grizzle, MD  Prenatal Vit-Fe Fumarate-FA (PRENATAL MULTIVITAMIN) TABS tablet Take 1 tablet by mouth daily at 12 noon.    [provider]      Allergies    Patient has no known allergies.    Review of Systems   Review of Systems  HENT:  Positive for sore throat.    All other systems reviewed and are negative.   Physical Exam Updated Vital Signs BP 118/77 (BP Location: Left Arm)   Pulse 96   Temp 99.5 F (37.5 C) (Oral)   Resp 18   Wt 74.1 kg   SpO2 100%   BMI 28.05 kg/m  Physical Exam Vitals and nursing note reviewed.  Constitutional:      General: She is not in acute distress.    Appearance: She is well-developed.  HENT:     Head: Normocephalic and atraumatic.     Right Ear: Tympanic membrane normal.     Left Ear: Tympanic membrane normal.     Mouth/Throat:     Mouth: Mucous membranes are moist. No oral lesions.     Pharynx: Posterior oropharyngeal erythema present. No oropharyngeal exudate or uvula swelling.     Tonsils: No tonsillar exudate or tonsillar abscesses. 2+ on the right. 2+ on the left.  Eyes:     Conjunctiva/sclera: Conjunctivae normal.  Cardiovascular:     Rate and Rhythm: Normal rate and regular rhythm.     Heart sounds: No murmur heard. Pulmonary:     Effort: Pulmonary effort is normal. No respiratory distress.     Breath sounds: Normal breath sounds.  Abdominal:     Palpations: Abdomen is soft.     Tenderness: There is no abdominal tenderness.  Musculoskeletal:  General: No swelling.     Cervical back: Neck supple.  Skin:    General: Skin is warm and dry.     Capillary Refill: Capillary refill takes less than 2 seconds.  Neurological:     Mental Status: She is alert.  Psychiatric:        Mood and Affect: Mood normal.     ED Results / Procedures / Treatments   Labs (all labs ordered are listed, but only abnormal results are displayed) Labs Reviewed  GROUP A STREP BY PCR    EKG None  Radiology No results found.  Procedures Procedures   Medications Ordered in ED Medications  dexamethasone (DECADRON) tablet 10 mg (has no administration in time range)    ED Course/ Medical Decision Making/ A&P                           Medical Decision Making Risk Prescription drug  management.   This patient presents to the ED for concern of sore throat. Differential diagnosis includes strep pharyngitis, viral pharyngitis, viral URI, PTA   Lab Tests:  I Ordered, and personally interpreted labs.  The pertinent results include: Negative group A strep   Medicines ordered and prescription drug management:  I ordered medication including Decadron for pharyngitis Reevaluation of the patient after these medicines showed that the patient unchanged I have reviewed the patients home medicines and have made adjustments as needed   Problem List / ED Course:  Patient presented to the emergency department complaints of a sore throat.  She reports that she has been experiencing sore throat associated fever for the last day or so.  Denies any nausea or known sick contacts.  Rapid strep test was negative.  Based on patient's symptoms, I believe that she would benefit from a dose of Decadron administered here in the emergency department for symptomatic relief of sore throat that has been making eating and drinking difficult.  Patient is agreeable with this plan.  Advised patient to continue to manage symptoms at home with over-the-counter pain medication such as Tylenol and ibuprofen.  Also encourage patient to continue to drink cold and warm drinks that are able to soothe her throat and also encourage electrolyte replenishment with electrolyte drinks to ensure that patient is not suffering from dehydration due to the difficulty swallowing.  Advised patient of expectant management and how long symptoms should the last and when to return back to the emergency department for acute concerns of worsening.  Also encourage patient to follow with primary care prior to further evaluation.  Patient is agreeable to treatment plan verbalized understanding of return precautions.  All questions answered prior to patient discharge.  Final Clinical Impression(s) / ED Diagnoses Final diagnoses:  Viral  pharyngitis    Rx / DC Orders ED Discharge Orders     None         Smitty Knudsen, PA-C 03/29/23 2014    Tegeler, Canary Brim, MD 03/29/23 2308

## 2023-03-29 NOTE — Discharge Instructions (Signed)
You were seen in the emergency department for a sore throat. Your strep test was negative so at this time, antibiotic treatment is not indicated. However, given the significant pain and discomfort you are having with eating and drinking, a dose of decadron was given to you here in the ED. At home, take Tylenol/Ibuprofen for fevers and sore throat. If you are concerned that your symptoms are worsening, please return to the emergency department for further evaluation. Otherwise, expect to feel somewhat sick for the next 4-7 days but slowly improving during this time. Follow up with your primary care provider for further evaluation if needed.

## 2023-03-29 NOTE — ED Triage Notes (Signed)
C/O fever and sore throat since last night.

## 2023-05-17 ENCOUNTER — Ambulatory Visit (INDEPENDENT_AMBULATORY_CARE_PROVIDER_SITE_OTHER): Payer: Medicaid Other | Admitting: Obstetrics and Gynecology

## 2023-05-17 ENCOUNTER — Encounter: Payer: Self-pay | Admitting: Obstetrics and Gynecology

## 2023-05-17 ENCOUNTER — Other Ambulatory Visit (HOSPITAL_COMMUNITY)
Admission: RE | Admit: 2023-05-17 | Discharge: 2023-05-17 | Disposition: A | Discharge status: Home / Self Care | Payer: Medicaid Other | Source: Ambulatory Visit | Attending: Obstetrics and Gynecology | Admitting: Obstetrics and Gynecology

## 2023-05-17 VITALS — BP 115/78 | HR 87 | Wt 166.0 lb

## 2023-05-17 DIAGNOSIS — N898 Other specified noninflammatory disorders of vagina: Secondary | ICD-10-CM | POA: Insufficient documentation

## 2023-05-17 DIAGNOSIS — N941 Unspecified dyspareunia: Secondary | ICD-10-CM

## 2023-05-17 DIAGNOSIS — Z975 Presence of (intrauterine) contraceptive device: Secondary | ICD-10-CM | POA: Diagnosis not present

## 2023-05-17 DIAGNOSIS — Z01419 Encounter for gynecological examination (general) (routine) without abnormal findings: Secondary | ICD-10-CM | POA: Insufficient documentation

## 2023-05-17 NOTE — Progress Notes (Signed)
  CC: pain with intercourse Subjective:    Patient ID: Miranda Caldwell, female    DOB: 1986/08/03, 37 y.o.   MRN: [id number]  HPI 37 yo G1P1, SVD x1 seen for 5 month history of pain with intercourse as well as vaginal discharge. Pt notes a numbing pain with initial as well as deep penetration.  Pt does note limited foreplay and no use of lubrication.  She does note occasional vaginal dryness during intimacy.  Pt notes discomfort in all positions.  Pt denies nausea/vomiting, dysuria, constipation and diarrhea. Pt was also concerned regarding amenorrhea.  Pt advised this is normal with a progesterone IUD.  She has had her current IUD for 2 years.   Review of Systems     Objective:   Physical Exam Constitutional:      Appearance: Normal appearance. She is normal weight.  HENT:     Head: Normocephalic and atraumatic.  Cardiovascular:     Rate and Rhythm: Normal rate and regular rhythm.     Heart sounds: Normal heart sounds.  Pulmonary:     Effort: Pulmonary effort is normal.     Breath sounds: Normal breath sounds.  Abdominal:     General: Abdomen is flat. There is no distension.     Palpations: Abdomen is soft. There is no mass.     Tenderness: There is no guarding.     Hernia: No hernia is present.     Comments: Diffuse very mild lower abdominal pain  Genitourinary:    Comments: Ext genitalia: WNL Vagina: WNL, no excessive discharge noted Cervix WNL, pap taken without incident, chaperone present, IUD string easily seen and appropriate Uterus: normal in size, mobile, nontender Adnexa: no fullness or tenderness Neurological:     Mental Status: She is alert.    Vitals:   05/17/23 0840  BP: 115/78  Pulse: 87         Assessment & Plan:   1. Dyspareunia in female Most likely reason is lack of lubrication. Advised more foreplay and use of lubrication If still uncomfortable at 6 week followup, can get pelvic ultrasound.  U/S offered today, but pt declined - Cervicovaginal  ancillary only( [facility])  2. Presence of IUD   3. Vaginal discharge No obvious discharge, swab taken and is pending - Cervicovaginal ancillary only( [facility])  Follow up in 6 weeks, in person  Warden Fillers, MD Faculty Attending, Center for [facility]

## 2023-05-18 LAB — CYTOLOGY - PAP: Diagnosis: NEGATIVE

## 2023-05-18 LAB — CERVICOVAGINAL ANCILLARY ONLY
Bacterial Vaginitis (gardnerella): POSITIVE — AB
Candida Glabrata: NEGATIVE
Candida Vaginitis: POSITIVE — AB
Comment: NEGATIVE
Comment: NEGATIVE
Comment: NEGATIVE
Comment: NEGATIVE
Trichomonas: NEGATIVE

## 2023-05-20 ENCOUNTER — Other Ambulatory Visit: Payer: Self-pay | Admitting: Obstetrics and Gynecology

## 2023-05-20 DIAGNOSIS — B3731 Acute candidiasis of vulva and vagina: Secondary | ICD-10-CM

## 2023-05-20 DIAGNOSIS — N760 Acute vaginitis: Secondary | ICD-10-CM

## 2023-05-20 MED ORDER — FLUCONAZOLE 150 MG PO TABS: 150.0000 mg | ORAL_TABLET | Freq: Once | ORAL | 0 refills | Status: AC

## 2023-05-20 MED ORDER — METRONIDAZOLE 500 MG PO TABS: 500.0000 mg | ORAL_TABLET | Freq: Two times a day (BID) | ORAL | 0 refills | Status: AC

## 2023-06-28 ENCOUNTER — Ambulatory Visit: Payer: Medicaid Other | Admitting: Medical

## 2023-11-17 ENCOUNTER — Telehealth: Payer: Self-pay

## 2023-11-17 NOTE — Telephone Encounter (Signed)
Returned patient's call with [employer] [phone]. Left message for patient to call the office back. Ellianah Cordy l Autumm Hattery, CMA

## 2023-11-17 NOTE — Telephone Encounter (Signed)
-----   Message from Neale Burly sent at 11/14/2023  1:01 PM EST ----- Patient wanted to speak with a nurse.   Best contact number is [phone]

## 2024-05-29 ENCOUNTER — Ambulatory Visit: Admitting: Obstetrics and Gynecology

## 2024-05-29 VITALS — BP 112/75 | HR 88 | Ht 65.0 in | Wt 173.1 lb

## 2024-05-29 DIAGNOSIS — Z30432 Encounter for removal of intrauterine contraceptive device: Secondary | ICD-10-CM

## 2024-05-29 NOTE — Progress Notes (Signed)
    GYNECOLOGY OFFICE PROCEDURE NOTE  Miranda Caldwell is a 38 y.o. G1P1001 here for Liletta  IUD removal. No GYN concerns.  Last pap smear was on 05/17/23 and was normal.  IUD Removal  Patient identified, informed consent performed, consent signed.  Patient was in the dorsal lithotomy position, normal external genitalia was noted.  A speculum was placed in the patient's vagina, normal discharge was noted, no lesions. The cervix was visualized, no lesions, no abnormal discharge.  The strings of the IUD were grasped and pulled using ring forceps. The IUD was removed in its entirety. Patient tolerated the procedure well.    Plans for pregnancy soon and instructed to take PNV and folic acid.  Routine preventative health maintenance measures emphasized.  Return for annual well woman  Nidia Daring, FNP Center for [employer], [facility]

## 2024-07-10 ENCOUNTER — Ambulatory Visit (INDEPENDENT_AMBULATORY_CARE_PROVIDER_SITE_OTHER): Admitting: Obstetrics and Gynecology

## 2024-07-10 VITALS — BP 109/72 | HR 70 | Ht 65.0 in | Wt 173.0 lb

## 2024-07-10 DIAGNOSIS — R5383 Other fatigue: Secondary | ICD-10-CM

## 2024-07-10 DIAGNOSIS — Z01419 Encounter for gynecological examination (general) (routine) without abnormal findings: Secondary | ICD-10-CM

## 2024-07-10 DIAGNOSIS — R6882 Decreased libido: Secondary | ICD-10-CM | POA: Diagnosis not present

## 2024-07-10 DIAGNOSIS — Z1331 Encounter for screening for depression: Secondary | ICD-10-CM | POA: Diagnosis not present

## 2024-07-10 NOTE — Progress Notes (Signed)
 ANNUAL EXAM Patient name: Miranda Caldwell MRN [id number]  Date of birth: [birth year] Chief Complaint:   No chief complaint on file.  History of Present Illness:   Miranda Caldwell is a 38 y.o. G5P1001  female being seen today for a routine annual exam.  Current complaints: She reports fatigue since IUD removal. She also endorses low libido. Denies vaginal or pelvic pain. She endorses mood changes as well. She is trying to get outside and work out  and lean on her support  She does not have a PCP  Patient's last menstrual period was 07/07/2024 (exact date).   The pregnancy intention screening data noted above was reviewed. Potential methods of contraception were discussed. The patient elected to proceed with No data recorded.   Gynecologic History Patient's last menstrual period was . Contraception:  Last Pap: 2024. Results were: Normal Last mammogram:n/a     07/10/2024    9:34 AM 06/11/2021   10:45 AM 04/02/2021   11:44 AM  Depression screen PHQ 2/9  Decreased Interest 3 0 2  Down, Depressed, Hopeless 2 0 2  PHQ - 2 Score 5 0 4  Altered sleeping 3 0 1  Tired, decreased energy 3 0 1  Change in appetite 2 0 1  Feeling bad or failure about yourself  0 0 0  Trouble concentrating 1 0 0  Moving slowly or fidgety/restless 1 0 1  Suicidal thoughts 0 0 0  PHQ-9 Score 15 0 8        07/10/2024    9:35 AM  GAD 7 : Generalized Anxiety Score  Nervous, Anxious, on Edge 1  Control/stop worrying 1  Worry too much - different things 1  Trouble relaxing 1  Restless 0  Easily annoyed or irritable 0  Afraid - awful might happen 0  Total GAD 7 Score 4     Review of Systems:   Pertinent items are noted in HPI Denies any headaches, blurred vision, fatigue, shortness of breath, chest pain, abdominal pain, abnormal vaginal discharge/itching/odor/irritation, problems with periods, bowel movements, urination, or intercourse unless otherwise stated above. Pertinent History Reviewed:   Reviewed past medical,surgical, social and family history.  Reviewed problem list, medications and allergies. Physical Assessment:   Vitals:   07/10/24 0923  BP: 109/72  Pulse: 70  Weight: 173 lb (78.5 kg)  Height: 5' 5 (1.651 m)  Body mass index is 28.79 kg/m.        Physical Examination:   General appearance - well appearing, and in no distress  Mental status - alert, oriented   Psych:  She has a normal mood and affect  Skin - warm and dry, normal color  Chest - effort normal, all lung fields clear to auscultation bilaterally  Heart - normal rate and regular rhythm  Neck:  midline trachea, no thyromegaly or nodules  Breasts - breasts appear normal, no suspicious masses, no skin or nipple changes or  axillary nodes  Abdomen - soft, nontender  Pelvic - VULVA: normal appearing vulva with no masses, tenderness or lesions  VAGINA: normal appearing vagina with normal color and discharge, no lesions  CERVIX: normal appearing cervix without discharge or lesions    Extremities:  No swelling or varicosities noted  Chaperone present for exam  No results found for this or any previous visit (from the past 24 hours).  Assessment & Plan:  1. Encounter for annual routine gynecological examination (Primary) UTD on Pap today Mammogram at 41 Checking routine labs today - CBC -  Comp Met (CMET) - TSH - Lipid panel - Vitamin D  (25 hydroxy)  2. Other fatigue  - TSH - Vitamin D  (25 hydroxy)  3. Low libido Discussed low libido, discussed how mood can impact this lifestyle changes, discussed medication, discussed sex therapy.  She is going to think about it and let us  know if she desires any of these options  4. Positive depression screening Discussed mood, offered counseling she declines today Discussed medication as needed She is focusing on getting outside, utilizing support, working out, she will let us  know if desires any other help  Labs/procedures today:   Mammogram: @  38yo, or sooner if problems   Orders Placed This Encounter  Procedures   CBC   Comp Met (CMET)   TSH   Vitamin D  (25 hydroxy)   Lipid panel    Meds: No orders of the defined types were placed in this encounter.   Follow-up: Return in about 1 year (around 07/10/2025) for [name] Miranda Delores, FNP

## 2024-07-11 ENCOUNTER — Ambulatory Visit: Payer: Self-pay | Admitting: Obstetrics and Gynecology

## 2024-07-11 DIAGNOSIS — E559 Vitamin D deficiency, unspecified: Secondary | ICD-10-CM

## 2024-07-11 LAB — COMPREHENSIVE METABOLIC PANEL WITH GFR
ALT: 17 IU/L (ref 0–32)
AST: 19 IU/L (ref 0–40)
Albumin: 4.5 g/dL (ref 3.9–4.9)
Alkaline Phosphatase: 93 IU/L (ref 44–121)
BUN/Creatinine Ratio: 12 (ref 9–23)
BUN: 10 mg/dL (ref 6–20)
Bilirubin Total: 0.2 mg/dL (ref 0.0–1.2)
CO2: 20 mmol/L (ref 20–29)
Calcium: 9.7 mg/dL (ref 8.7–10.2)
Chloride: 102 mmol/L (ref 96–106)
Creatinine, Ser: 0.81 mg/dL (ref 0.57–1.00)
Globulin, Total: 3 g/dL (ref 1.5–4.5)
Glucose: 92 mg/dL (ref 70–99)
Potassium: 4.8 mmol/L (ref 3.5–5.2)
Sodium: 136 mmol/L (ref 134–144)
Total Protein: 7.5 g/dL (ref 6.0–8.5)
eGFR: 96 mL/min/1.73 (ref >59)

## 2024-07-11 LAB — TSH: TSH: 0.718 u[IU]/mL (ref 0.450–4.500)

## 2024-07-11 LAB — CBC
Hematocrit: 39.3 % (ref 34.0–46.6)
Hemoglobin: 12.6 g/dL (ref 11.1–15.9)
MCH: 28.4 pg (ref 26.6–33.0)
MCHC: 32.1 g/dL (ref 31.5–35.7)
MCV: 89 fL (ref 79–97)
Platelets: 267 x10E3/uL (ref 150–450)
RBC: 4.44 x10E6/uL (ref 3.77–5.28)
RDW: 13.8 % (ref 11.7–15.4)
WBC: 4 x10E3/uL (ref 3.4–10.8)

## 2024-07-11 LAB — VITAMIN D 25 HYDROXY (VIT D DEFICIENCY, FRACTURES): Vit D, 25-Hydroxy: 26.2 ng/mL — ABNORMAL LOW (ref 30.0–100.0)

## 2024-07-11 LAB — LIPID PANEL W/O CHOL/HDL RATIO
Cholesterol, Total: 197 mg/dL (ref 100–199)
HDL: 34 mg/dL — ABNORMAL LOW (ref >39)
LDL Chol Calc (NIH): 125 mg/dL — ABNORMAL HIGH (ref 0–99)
Triglycerides: 216 mg/dL — ABNORMAL HIGH (ref 0–149)
VLDL Cholesterol Cal: 38 mg/dL (ref 5–40)

## 2024-07-11 MED ORDER — CHOLECALCIFEROL 20 MCG (800 UNIT) PO TABS: ORAL_TABLET | ORAL | 1 refills | Status: DC

## 2024-07-23 ENCOUNTER — Telehealth: Payer: Self-pay

## 2024-07-23 NOTE — Telephone Encounter (Signed)
 Patient walked into the office to discuss results. Patient made aware that her cholesterol is slightly elevated and her Vitamin D  is slightly low. Per Nidia Daring FNP, it is recommended that she exercise 150 minutes weekly,and decrease trans fats in her diet. Vitamin D  was sent to her pharmacy. Understanding was voiced. Lillee Mooneyhan l Denise Washburn, CMA

## 2024-12-27 ENCOUNTER — Inpatient Hospital Stay (HOSPITAL_COMMUNITY)
Admission: AD | Admit: 2024-12-27 | Discharge: 2024-12-27 | Disposition: A | Discharge status: Home / Self Care | Attending: Obstetrics & Gynecology | Admitting: Obstetrics & Gynecology

## 2024-12-27 ENCOUNTER — Encounter (HOSPITAL_COMMUNITY): Payer: Self-pay

## 2024-12-27 ENCOUNTER — Inpatient Hospital Stay (HOSPITAL_COMMUNITY)

## 2024-12-27 ENCOUNTER — Other Ambulatory Visit: Payer: Self-pay

## 2024-12-27 DIAGNOSIS — O209 Hemorrhage in early pregnancy, unspecified: Secondary | ICD-10-CM | POA: Insufficient documentation

## 2024-12-27 DIAGNOSIS — O26899 Other specified pregnancy related conditions, unspecified trimester: Secondary | ICD-10-CM

## 2024-12-27 DIAGNOSIS — O26891 Other specified pregnancy related conditions, first trimester: Secondary | ICD-10-CM | POA: Diagnosis not present

## 2024-12-27 DIAGNOSIS — Z3A01 Less than 8 weeks gestation of pregnancy: Secondary | ICD-10-CM | POA: Insufficient documentation

## 2024-12-27 DIAGNOSIS — R7989 Other specified abnormal findings of blood chemistry: Secondary | ICD-10-CM

## 2024-12-27 DIAGNOSIS — R109 Unspecified abdominal pain: Secondary | ICD-10-CM | POA: Diagnosis not present

## 2024-12-27 DIAGNOSIS — Z758 Other problems related to medical facilities and other health care: Secondary | ICD-10-CM

## 2024-12-27 DIAGNOSIS — O09521 Supervision of elderly multigravida, first trimester: Secondary | ICD-10-CM | POA: Insufficient documentation

## 2024-12-27 DIAGNOSIS — O3680X0 Pregnancy with inconclusive fetal viability, not applicable or unspecified: Secondary | ICD-10-CM

## 2024-12-27 LAB — URINALYSIS, ROUTINE W REFLEX MICROSCOPIC
Bacteria, UA: NONE SEEN
Bilirubin Urine: NEGATIVE
Glucose, UA: NEGATIVE mg/dL
Ketones, ur: NEGATIVE mg/dL
Leukocytes,Ua: NEGATIVE
Nitrite: NEGATIVE
Protein, ur: NEGATIVE mg/dL
Specific Gravity, Urine: 1.003 — ABNORMAL LOW (ref 1.005–1.030)
pH: 6 (ref 5.0–8.0)

## 2024-12-27 LAB — ABO/RH: ABO/RH(D): A POS

## 2024-12-27 LAB — CBC
HCT: 36.6 % (ref 36.0–46.0)
Hemoglobin: 12.5 g/dL (ref 12.0–15.0)
MCH: 28.8 pg (ref 26.0–34.0)
MCHC: 34.2 g/dL (ref 30.0–36.0)
MCV: 84.3 fL (ref 80.0–100.0)
Platelets: 294 10*3/uL (ref 150–400)
RBC: 4.34 MIL/uL (ref 3.87–5.11)
RDW: 14.4 % (ref 11.5–15.5)
WBC: 7.3 10*3/uL (ref 4.0–10.5)
nRBC: 0 % (ref 0.0–0.2)

## 2024-12-27 LAB — WET PREP, GENITAL
Clue Cells Wet Prep HPF POC: NONE SEEN
Sperm: NONE SEEN
Trich, Wet Prep: NONE SEEN
WBC, Wet Prep HPF POC: <10
Yeast Wet Prep HPF POC: NONE SEEN

## 2024-12-27 LAB — POCT PREGNANCY, URINE: Preg Test, Ur: POSITIVE — AB

## 2024-12-27 LAB — HCG, QUANTITATIVE, PREGNANCY: hCG, Beta Chain, Quant, S: 114 m[IU]/mL — ABNORMAL HIGH

## 2024-12-27 NOTE — MAU Provider Note (Signed)
 Chief Complaint:  Vaginal Bleeding and Abdominal Pain (cramping)   HPI   None     Miranda Caldwell is a 39 y.o. G2P1001 at [redacted]w[redacted]d who presents to maternity admissions reporting vaginal spotting and abdominal cramps. She reports one episode of light pink on the toilet paper after wiping at 1400 today. She endorses 3/10 abdominal cramps one time.   Pregnancy Course: [facility] at [facility] .   No past medical history on file. OB History  Gravida Para Term Preterm AB Living  2 1 1   1   SAB IAB Ectopic Multiple Live Births     0 1    # Outcome Date GA Lbr Len/2nd Weight Sex Type Anes PTL Lv  2 Current           1 Term 04/28/21 [redacted]w[redacted]d 03:44 / 00:21 3135 g F Vag-Spont EPI  LIV   No past surgical history on file. Family History  Problem Relation Age of Onset   Stroke Neg Hx    Obesity Neg Hx    Hypertension Neg Hx    Social History[1] Allergies[2] No medications prior to admission.    I have reviewed patient's Past Medical Hx, Surgical Hx, Family Hx, Social Hx, medications and allergies.   ROS  Pertinent items noted in HPI and remainder of comprehensive ROS otherwise negative.   PHYSICAL EXAM  Patient Vitals for the past 24 hrs:  BP Temp Temp src Pulse Resp SpO2  12/27/24 1759 122/76 98.1 F (36.7 C) Oral 71 16 100 %    Constitutional: Well-developed, well-nourished female in no acute distress.  HEENT: atraumatic, normocephalic. Neck has normal ROM. EOM intact. Cardiovascular: normal rate & rhythm, warm and well-perfused Respiratory: normal effort, no problems with respiration noted GI: Abd soft, non-tender, non-distended MSK: Extremities nontender, no edema, normal ROM Skin: warm and dry. Acyanotic, no jaundice or pallor. Neurologic: Alert and oriented x 4. No abnormal coordination. Psychiatric: Normal mood. Speech not slurred, not rapid/pressured. Patient is cooperative.  Labs: Results for orders placed or performed during the  hospital encounter of 12/27/24 (from the past 24 hours)  Wet prep, genital     Status: None   Collection Time: 12/27/24  5:18 PM   Specimen: PATH Cytology Cervicovaginal Ancillary Only  Result Value Ref Range   Yeast Wet Prep HPF POC NONE SEEN NONE SEEN   Trich, Wet Prep NONE SEEN NONE SEEN   Clue Cells Wet Prep HPF POC NONE SEEN NONE SEEN   WBC, Wet Prep HPF POC <10 <10   Sperm NONE SEEN   Pregnancy, urine POC     Status: Abnormal   Collection Time: 12/27/24  5:22 PM  Result Value Ref Range   Preg Test, Ur POSITIVE (A) NEGATIVE  CBC     Status: None   Collection Time: 12/27/24  7:22 PM  Result Value Ref Range   WBC 7.3 4.0 - 10.5 K/uL   RBC 4.34 3.87 - 5.11 MIL/uL   Hemoglobin 12.5 12.0 - 15.0 g/dL   HCT 63.3 63.9 - 53.9 %   MCV 84.3 80.0 - 100.0 fL   MCH 28.8 26.0 - 34.0 pg   MCHC 34.2 30.0 - 36.0 g/dL   RDW 85.5 88.4 - 84.4 %   Platelets 294 150 - 400 K/uL   nRBC 0.0 0.0 - 0.2 %  ABO/Rh     Status: None   Collection Time: 12/27/24  7:22 PM  Result Value Ref Range   ABO/RH(D)  A POS    No rh immune globuloin      NOT A RH IMMUNE GLOBULIN CANDIDATE, PT RH POSITIVE Performed at [facility], [address], [place], [place] [address]   hCG, quantitative, pregnancy     Status: Abnormal   Collection Time: 12/27/24  7:22 PM  Result Value Ref Range   hCG, Beta Chain, Quant, S 114 (H) <5 mIU/mL  Urinalysis, Routine w reflex microscopic -Urine, Clean Catch     Status: Abnormal   Collection Time: 12/27/24  7:22 PM  Result Value Ref Range   Color, Urine COLORLESS (A) YELLOW   APPearance CLEAR CLEAR   Specific Gravity, Urine 1.003 (L) 1.005 - 1.030   pH 6.0 5.0 - 8.0   Glucose, UA NEGATIVE NEGATIVE mg/dL   Hgb urine dipstick SMALL (A) NEGATIVE   Bilirubin Urine NEGATIVE NEGATIVE   Ketones, ur NEGATIVE NEGATIVE mg/dL   Protein, ur NEGATIVE NEGATIVE mg/dL   Nitrite NEGATIVE NEGATIVE   Leukocytes,Ua NEGATIVE NEGATIVE   RBC / HPF 0-5 0 - 5 RBC/hpf   WBC, UA 0-5 0 - 5  WBC/hpf   Bacteria, UA NONE SEEN NONE SEEN   Squamous Epithelial / HPF 0-5 0 - 5 /HPF   Imaging:  US  OB LESS THAN 14 WEEKS WITH OB TRANSVAGINAL Result Date: 12/27/2024 EXAM: US  LESS THAN 14 WEEKS PREGNANCY WITH TRANSVAGINAL TECHNIQUE: Transvaginal first trimester obstetric pelvic duplex ultrasound was performed with real-time imaging, color flow Doppler imaging, and spectral analysis. COMPARISON: None available. CLINICAL HISTORY: Vaginal bleeding in pregnancy, first trimester. 5 weeks and 6 days gestational age by last menstrual period. Estimated due date by last menstrual period: 06/22/2025. Last menstrual period: 11/16/2024. FINDINGS: UTERUS: No focal myometrial mass. GESTATIONAL SAC(S): Intrauterine gestational sac: None. YOLK SAC: Yolk sac: No. EMBRYO(<11WK) /FETUS(>=11WK): No embryo or fetus identified. CROWN RUMP LENGTH: Not measured RATE OF CARDIAC ACTIVITY: Cardiac activity: No. RIGHT OVARY: Corpus luteum cyst within the right ovary. LEFT OVARY: Unremarkable. FREE FLUID: No free fluid within the pelvis. MEASUREMENTS ESTIMATED GESTATIONAL AGE BY LMP/PRIOR ULTRASOUND: 5 weeks and 6 days gestational age by last menstrual period. ESTIMATED DUE DATE BY LMP: 06/22/2025. IMPRESSION: 1. Non-localization of the pregnancy on this scan. No intrauterine gestational sac. No abnormal ovarian or adnexal masses. Differential diagnosis includes intrauterine gestation too early to visualize, spontaneous abortion, or occult ectopic gestation. Recommend close clinical follow-up and serial serum beta-HCG monitoring, with repeat obstetric scan as warranted by beta-HCG levels and clinical assessment. (Ref.: J Vasc Surg. 2018; 67:2-77 and J Am Coll Radiol 2013;10(10):789-794.) Electronically signed by: Morgane Naveau MD 12/27/2024 07:15 PM EST RP Workstation: [id number]    MDM & MAU COURSE  MDM: High  MAU Course: -Vital signs within normal limits.  -Ectopic rule out: CBC, US , beta-hCG. Blood type already on file - A  Positive. -Wet prep, UA, and GC/chlamydia to rule out infectious causes.  -Wet prep negative. -US  does not show IUGS, no adnexal masses. -CBC within normal limits. -hCG 114, below threshold to visualize pregnancy on US . -Repeat hCG in 48 hours, return on 12/30/2024. -Evaluation does not show pathology that would require ongoing emergent intervention or inpatient treatment. Patient is hemodynamically stable and mentating appropriately. Discussed findings and plan with patient, who agrees with care plan.   Differential diagnosis considered for 1st trimester vaginal bleeding includes but is not limited to: ectopic pregnancy, complete spontaneous abortion, incomplete abortion, missed abortion, threatened abortion, embryonic/fetal demise, cervical insufficiency, cervical or vaginal disorder   Orders Placed This  Encounter  Procedures   Wet prep, genital   US  OB LESS THAN 14 WEEKS WITH OB TRANSVAGINAL   CBC   hCG, quantitative, pregnancy   Urinalysis, Routine w reflex microscopic -Urine, Clean Catch   Diet NPO time specified   Pregnancy, urine POC   ABO/Rh   Discharge patient   No orders of the defined types were placed in this encounter.   ASSESSMENT   1. Abdominal cramping affecting pregnancy   2. Elevated serum hCG   3. Pregnancy, location unknown   4. [redacted] weeks gestation of pregnancy   5. Language barrier     PLAN  Discharge home in stable condition with ectopic precautions.  Return to MAU on 12/30/2024 for repeat hCG.     Allergies as of 12/27/2024   No Known Allergies      Medication List    You have not been prescribed any medications.     Joesph DELENA Sear, PA      [1]  Social History Tobacco Use   Smoking status: Never   Smokeless tobacco: Never  Vaping Use   Vaping status: Never Used  Substance Use Topics   Alcohol use: Never   Drug use: Never  [2] No Known Allergies

## 2024-12-27 NOTE — Discharge Instructions (Addendum)
 VAGINAL BLEEDING DURING PREGNANCY: A small amount of bleeding from the vagina is common during pregnancy. Sometimes the bleeding is normal and does not cause problems. At other times, though, bleeding may be a sign of something serious. EARLY PREGNANCY (FIRST TRIMESTER) Normal bleeding in pregnancy can happen: When the fertilized egg attaches itself to your uterus. When blood vessels change because of the pregnancy. When you have pelvic exams. When you have sex. Abnormal bleeding can happen: When you have an infection. When you have growths in your womb. If you're having a miscarriage or at risk of having one. If you have other problems in your pregnancy. To determine if your bleeding is normal, we will need to repeat your blood work on Sunday February 1. How much the pregnancy hormone level in your blood changes over that specific period of time tells us  if the pregnancy is developing normally.  Reasons to return to MAU at [facility]: If you have heavier bleeding that soaks through more that 2 pads per hour for an hour or more If you bleed so much that you feel like you might pass out or you do pass out If you have significant abdominal pain that is not improved with Tylenol  1000 mg every 8 hours as needed for pain If you develop a fever > 100.5

## 2024-12-27 NOTE — MAU Note (Signed)
 Miranda Caldwell is a 40 y.o. at Unknown here in MAU reporting: one episode with pink on tissue when wiping and 1 time cramping mid abd  LMP: 11/16/24 Onset of complaint: 1400 Pain score: 3/10 Vitals:   12/27/24 1759  BP: 122/76  Pulse: 71  Resp: 16  Temp: 98.1 F (36.7 C)  SpO2: 100%     FHT: na  Lab orders placed from triage: done by pa

## 2024-12-27 NOTE — Progress Notes (Signed)
 Joesph Sear PA in to see pt and discuss test results and d/c plan. Written and verbal d/c instructions given and pt voiced understanding. Pt then d/c home by provider

## 2024-12-28 LAB — GC/CHLAMYDIA PROBE AMP (~~LOC~~) NOT AT ARMC
Chlamydia: NEGATIVE
Comment: NEGATIVE
Comment: NORMAL
Neisseria Gonorrhea: NEGATIVE

## 2024-12-31 ENCOUNTER — Other Ambulatory Visit: Payer: Self-pay

## 2024-12-31 ENCOUNTER — Inpatient Hospital Stay (HOSPITAL_COMMUNITY)
Admission: AD | Admit: 2024-12-31 | Discharge: 2024-12-31 | Disposition: A | Discharge status: Home / Self Care | Payer: Self-pay | Attending: Obstetrics & Gynecology | Admitting: Obstetrics & Gynecology

## 2024-12-31 DIAGNOSIS — Z3A01 Less than 8 weeks gestation of pregnancy: Secondary | ICD-10-CM | POA: Insufficient documentation

## 2024-12-31 DIAGNOSIS — O26891 Other specified pregnancy related conditions, first trimester: Secondary | ICD-10-CM | POA: Diagnosis not present

## 2024-12-31 DIAGNOSIS — R7989 Other specified abnormal findings of blood chemistry: Secondary | ICD-10-CM | POA: Diagnosis not present

## 2024-12-31 DIAGNOSIS — R102 Pelvic and perineal pain unspecified side: Secondary | ICD-10-CM | POA: Insufficient documentation

## 2024-12-31 LAB — HCG, QUANTITATIVE, PREGNANCY: hCG, Beta Chain, Quant, S: 251 m[IU]/mL — ABNORMAL HIGH

## 2024-12-31 NOTE — MAU Provider Note (Signed)
 " History     CSN: [id number]  Arrival date and time: 12/31/24 1656   Event Date/Time   First Provider Initiated Contact with Patient 12/31/24 1916      Chief Complaint  Patient presents with   HCG level   Miranda Caldwell presents to MAU for follow-up quant hCG blood draw today. She was seen in MAU for vaginal bleeding on 12/27/24. Patient endorses mild pain without bleeding today. Currently rates pain as 3/10. Denies attempting to relieve symptoms. Discussed with patient, we are following hCG levels today. Results will be back in approximately 2 hours. Patient desires to stay and await results.       OB History     Gravida  2   Para  1   Term  1   Preterm      AB      Living  1      SAB      IAB      Ectopic      Multiple  0   Live Births  1           No past medical history on file.  No past surgical history on file.  Family History  Problem Relation Age of Onset   Stroke Neg Hx    Obesity Neg Hx    Hypertension Neg Hx     Social History[1]  Allergies: Allergies[2]  No medications prior to admission.    Review of Systems  Constitutional:  Negative for chills, fatigue and fever.  Eyes:  Negative for pain and visual disturbance.  Respiratory:  Negative for apnea, shortness of breath and wheezing.   Cardiovascular:  Negative for chest pain and palpitations.  Gastrointestinal:  Negative for abdominal pain, constipation, diarrhea, nausea and vomiting.  Genitourinary:  Positive for pelvic pain. Negative for difficulty urinating, dysuria, vaginal bleeding, vaginal discharge and vaginal pain.  Musculoskeletal:  Negative for back pain.  Neurological:  Negative for seizures, weakness and headaches.  Psychiatric/Behavioral:  Negative for suicidal ideas.    Physical Exam   Blood pressure 116/79, pulse 85, temperature 98 F (36.7 C), temperature source Oral, resp. rate 20, height 5' 5 (1.651 m), weight 78.4 kg, last menstrual period  11/16/2024, SpO2 100%.  Physical Exam Vitals and nursing note reviewed.  Constitutional:      General: She is not in acute distress.    Appearance: Normal appearance.  HENT:     Head: Normocephalic.  Pulmonary:     Effort: Pulmonary effort is normal.  Musculoskeletal:     Cervical back: Normal range of motion.  Skin:    General: Skin is warm and dry.  Neurological:     Mental Status: She is alert and oriented to person, place, and time.  Psychiatric:        Mood and Affect: Mood normal.     MAU Course  Procedures Orders Placed This Encounter  Procedures   hCG, quantitative, pregnancy   Discharge patient Discharge disposition: 01-Home or Self Care; Discharge patient date: 12/31/2024   Results for orders placed or performed during the hospital encounter of 12/31/24 (from the past 24 hours)  hCG, quantitative, pregnancy     Status: Abnormal   Collection Time: 12/31/24  5:42 PM  Result Value Ref Range   hCG, Beta Chain, Quant, S 251 (H) <5 mIU/mL    MDM - HCG on 1/29 114 - HCG today 251. An initial rise however not a full 50% rise in 48 hours. This is  a highly desired pregnancy by the patient.  - plan to repeat quant in 48 hours.  - discharge home   Assessment and Plan   1. Elevated serum hCG   2. [redacted] weeks gestation of pregnancy    - As this is a highly desired pregnancy and pain has not changed, CNM agreeable to repeat quant in 48 hours with strict return precautions.  - Discussed the possibility of abnormally developing pregnancy with patient.  - Message sent to office to have appt changed to lab visit on Wednesday.  - Reviewed strict return precautions.  - Patient discharged home in stable condition and may return to MAU as needed.   Claris CHRISTELLA Cedar, MSN CNM  12/31/2024, 7:17 PM     [1]  Social History Tobacco Use   Smoking status: Never   Smokeless tobacco: Never  Vaping Use   Vaping status: Never Used  Substance Use Topics   Alcohol use: Never   Drug use:  Never  [2] No Known Allergies  "

## 2025-01-02 ENCOUNTER — Other Ambulatory Visit

## 2025-01-02 DIAGNOSIS — O4690 Antepartum hemorrhage, unspecified, unspecified trimester: Secondary | ICD-10-CM

## 2025-01-02 NOTE — Progress Notes (Signed)
 Patient presents for HCG. Patient was sent to the lab to have labs drawn. Miranda Caldwell l Miranda Caldwell, CMA

## 2025-01-03 ENCOUNTER — Encounter

## 2025-01-03 ENCOUNTER — Ambulatory Visit: Payer: Self-pay | Admitting: Obstetrics and Gynecology

## 2025-01-03 LAB — BETA HCG QUANT (REF LAB): hCG Quant: 373 m[IU]/mL

## 2025-02-06 ENCOUNTER — Encounter: Admitting: Obstetrics & Gynecology
